# Patient Record
Sex: Male | Born: 1940 | Race: White | Hispanic: No | Smoking: Former smoker
Health system: Southern US, Community
[De-identification: ages and names within clinical notes are randomized; demographics above are authoritative.]

## PROBLEM LIST (undated history)

## (undated) DIAGNOSIS — I4891 Unspecified atrial fibrillation: Secondary | ICD-10-CM

## (undated) DIAGNOSIS — D509 Iron deficiency anemia, unspecified: Secondary | ICD-10-CM

## (undated) DIAGNOSIS — H269 Unspecified cataract: Secondary | ICD-10-CM

## (undated) DIAGNOSIS — I10 Essential (primary) hypertension: Secondary | ICD-10-CM

## (undated) DIAGNOSIS — I509 Heart failure, unspecified: Secondary | ICD-10-CM

## (undated) HISTORY — PX: APPENDECTOMY: SHX54

## (undated) HISTORY — DX: Iron deficiency anemia, unspecified: D50.9

## (undated) HISTORY — PX: HERNIA REPAIR: SHX51

## (undated) HISTORY — DX: Unspecified cataract: H26.9

## (undated) HISTORY — DX: Essential (primary) hypertension: I10

## (undated) HISTORY — DX: Heart failure, unspecified: I50.9

---

## 2015-04-14 DIAGNOSIS — Z23 Encounter for immunization: Secondary | ICD-10-CM | POA: Diagnosis not present

## 2015-04-14 DIAGNOSIS — I509 Heart failure, unspecified: Secondary | ICD-10-CM | POA: Diagnosis not present

## 2015-04-14 DIAGNOSIS — Z79899 Other long term (current) drug therapy: Secondary | ICD-10-CM | POA: Diagnosis not present

## 2015-04-14 DIAGNOSIS — I10 Essential (primary) hypertension: Secondary | ICD-10-CM | POA: Diagnosis not present

## 2015-04-14 DIAGNOSIS — Z6838 Body mass index (BMI) 38.0-38.9, adult: Secondary | ICD-10-CM | POA: Diagnosis not present

## 2015-04-14 DIAGNOSIS — Z1389 Encounter for screening for other disorder: Secondary | ICD-10-CM | POA: Diagnosis not present

## 2015-04-14 DIAGNOSIS — E669 Obesity, unspecified: Secondary | ICD-10-CM | POA: Diagnosis not present

## 2015-04-14 DIAGNOSIS — I4891 Unspecified atrial fibrillation: Secondary | ICD-10-CM | POA: Diagnosis not present

## 2015-04-15 DIAGNOSIS — I482 Chronic atrial fibrillation, unspecified: Secondary | ICD-10-CM | POA: Diagnosis present

## 2015-06-04 DIAGNOSIS — R609 Edema, unspecified: Secondary | ICD-10-CM | POA: Diagnosis not present

## 2015-06-04 DIAGNOSIS — I509 Heart failure, unspecified: Secondary | ICD-10-CM | POA: Diagnosis not present

## 2015-06-04 DIAGNOSIS — E669 Obesity, unspecified: Secondary | ICD-10-CM | POA: Diagnosis not present

## 2015-06-04 DIAGNOSIS — H269 Unspecified cataract: Secondary | ICD-10-CM | POA: Diagnosis not present

## 2015-06-04 DIAGNOSIS — I4891 Unspecified atrial fibrillation: Secondary | ICD-10-CM | POA: Diagnosis not present

## 2015-06-04 DIAGNOSIS — Z6838 Body mass index (BMI) 38.0-38.9, adult: Secondary | ICD-10-CM | POA: Diagnosis not present

## 2015-06-04 DIAGNOSIS — Z8673 Personal history of transient ischemic attack (TIA), and cerebral infarction without residual deficits: Secondary | ICD-10-CM | POA: Diagnosis not present

## 2015-06-04 DIAGNOSIS — I872 Venous insufficiency (chronic) (peripheral): Secondary | ICD-10-CM | POA: Diagnosis not present

## 2015-06-04 DIAGNOSIS — I110 Hypertensive heart disease with heart failure: Secondary | ICD-10-CM | POA: Diagnosis not present

## 2015-08-12 DIAGNOSIS — Z6837 Body mass index (BMI) 37.0-37.9, adult: Secondary | ICD-10-CM | POA: Diagnosis not present

## 2015-08-12 DIAGNOSIS — I509 Heart failure, unspecified: Secondary | ICD-10-CM | POA: Diagnosis not present

## 2015-08-12 DIAGNOSIS — Z79899 Other long term (current) drug therapy: Secondary | ICD-10-CM | POA: Diagnosis not present

## 2015-08-12 DIAGNOSIS — I10 Essential (primary) hypertension: Secondary | ICD-10-CM | POA: Diagnosis not present

## 2015-08-12 DIAGNOSIS — I4891 Unspecified atrial fibrillation: Secondary | ICD-10-CM | POA: Diagnosis not present

## 2015-12-31 DIAGNOSIS — Z23 Encounter for immunization: Secondary | ICD-10-CM | POA: Diagnosis not present

## 2015-12-31 DIAGNOSIS — I10 Essential (primary) hypertension: Secondary | ICD-10-CM | POA: Diagnosis not present

## 2015-12-31 DIAGNOSIS — Z1211 Encounter for screening for malignant neoplasm of colon: Secondary | ICD-10-CM | POA: Diagnosis not present

## 2015-12-31 DIAGNOSIS — Z6833 Body mass index (BMI) 33.0-33.9, adult: Secondary | ICD-10-CM | POA: Diagnosis not present

## 2015-12-31 DIAGNOSIS — I509 Heart failure, unspecified: Secondary | ICD-10-CM | POA: Diagnosis not present

## 2015-12-31 DIAGNOSIS — R634 Abnormal weight loss: Secondary | ICD-10-CM | POA: Diagnosis not present

## 2015-12-31 DIAGNOSIS — I4891 Unspecified atrial fibrillation: Secondary | ICD-10-CM | POA: Diagnosis not present

## 2015-12-31 DIAGNOSIS — Z125 Encounter for screening for malignant neoplasm of prostate: Secondary | ICD-10-CM | POA: Diagnosis not present

## 2016-05-04 DIAGNOSIS — Z1389 Encounter for screening for other disorder: Secondary | ICD-10-CM | POA: Diagnosis not present

## 2016-05-04 DIAGNOSIS — Z9181 History of falling: Secondary | ICD-10-CM | POA: Diagnosis not present

## 2016-05-04 DIAGNOSIS — I10 Essential (primary) hypertension: Secondary | ICD-10-CM | POA: Diagnosis not present

## 2016-05-04 DIAGNOSIS — I509 Heart failure, unspecified: Secondary | ICD-10-CM | POA: Diagnosis not present

## 2016-05-04 DIAGNOSIS — Z139 Encounter for screening, unspecified: Secondary | ICD-10-CM | POA: Diagnosis not present

## 2016-05-04 DIAGNOSIS — Z79899 Other long term (current) drug therapy: Secondary | ICD-10-CM | POA: Diagnosis not present

## 2016-05-04 DIAGNOSIS — Z23 Encounter for immunization: Secondary | ICD-10-CM | POA: Diagnosis not present

## 2016-05-04 DIAGNOSIS — Z6833 Body mass index (BMI) 33.0-33.9, adult: Secondary | ICD-10-CM | POA: Diagnosis not present

## 2016-05-04 DIAGNOSIS — I4891 Unspecified atrial fibrillation: Secondary | ICD-10-CM | POA: Diagnosis not present

## 2016-12-02 DIAGNOSIS — H2513 Age-related nuclear cataract, bilateral: Secondary | ICD-10-CM | POA: Diagnosis not present

## 2017-01-11 DIAGNOSIS — H269 Unspecified cataract: Secondary | ICD-10-CM | POA: Insufficient documentation

## 2017-01-11 DIAGNOSIS — H2513 Age-related nuclear cataract, bilateral: Secondary | ICD-10-CM | POA: Diagnosis not present

## 2017-03-03 DIAGNOSIS — Z23 Encounter for immunization: Secondary | ICD-10-CM | POA: Diagnosis not present

## 2017-03-03 DIAGNOSIS — Z79899 Other long term (current) drug therapy: Secondary | ICD-10-CM | POA: Diagnosis not present

## 2017-03-03 DIAGNOSIS — I4891 Unspecified atrial fibrillation: Secondary | ICD-10-CM | POA: Diagnosis not present

## 2017-03-03 DIAGNOSIS — J069 Acute upper respiratory infection, unspecified: Secondary | ICD-10-CM | POA: Diagnosis not present

## 2017-03-03 DIAGNOSIS — I10 Essential (primary) hypertension: Secondary | ICD-10-CM | POA: Diagnosis not present

## 2017-03-03 DIAGNOSIS — I509 Heart failure, unspecified: Secondary | ICD-10-CM | POA: Diagnosis not present

## 2017-09-01 DIAGNOSIS — H269 Unspecified cataract: Secondary | ICD-10-CM | POA: Diagnosis not present

## 2017-09-01 DIAGNOSIS — Z9181 History of falling: Secondary | ICD-10-CM | POA: Diagnosis not present

## 2017-09-01 DIAGNOSIS — I509 Heart failure, unspecified: Secondary | ICD-10-CM | POA: Diagnosis not present

## 2017-09-01 DIAGNOSIS — I10 Essential (primary) hypertension: Secondary | ICD-10-CM | POA: Diagnosis not present

## 2017-09-01 DIAGNOSIS — Z79899 Other long term (current) drug therapy: Secondary | ICD-10-CM | POA: Diagnosis not present

## 2017-09-01 DIAGNOSIS — Z1331 Encounter for screening for depression: Secondary | ICD-10-CM | POA: Diagnosis not present

## 2017-09-01 DIAGNOSIS — Z125 Encounter for screening for malignant neoplasm of prostate: Secondary | ICD-10-CM | POA: Diagnosis not present

## 2017-09-01 DIAGNOSIS — I4891 Unspecified atrial fibrillation: Secondary | ICD-10-CM | POA: Diagnosis not present

## 2017-12-16 DIAGNOSIS — Z139 Encounter for screening, unspecified: Secondary | ICD-10-CM | POA: Diagnosis not present

## 2017-12-16 DIAGNOSIS — Z0000 Encounter for general adult medical examination without abnormal findings: Secondary | ICD-10-CM | POA: Diagnosis not present

## 2017-12-16 DIAGNOSIS — Z136 Encounter for screening for cardiovascular disorders: Secondary | ICD-10-CM | POA: Diagnosis not present

## 2017-12-16 DIAGNOSIS — E785 Hyperlipidemia, unspecified: Secondary | ICD-10-CM | POA: Diagnosis not present

## 2017-12-16 DIAGNOSIS — E669 Obesity, unspecified: Secondary | ICD-10-CM | POA: Diagnosis not present

## 2017-12-16 DIAGNOSIS — Z1339 Encounter for screening examination for other mental health and behavioral disorders: Secondary | ICD-10-CM | POA: Diagnosis not present

## 2017-12-16 DIAGNOSIS — Z6831 Body mass index (BMI) 31.0-31.9, adult: Secondary | ICD-10-CM | POA: Diagnosis not present

## 2017-12-16 DIAGNOSIS — Z125 Encounter for screening for malignant neoplasm of prostate: Secondary | ICD-10-CM | POA: Diagnosis not present

## 2018-03-03 DIAGNOSIS — I4891 Unspecified atrial fibrillation: Secondary | ICD-10-CM | POA: Diagnosis not present

## 2018-03-03 DIAGNOSIS — I509 Heart failure, unspecified: Secondary | ICD-10-CM | POA: Diagnosis not present

## 2018-03-03 DIAGNOSIS — Z23 Encounter for immunization: Secondary | ICD-10-CM | POA: Diagnosis not present

## 2018-03-03 DIAGNOSIS — Z6830 Body mass index (BMI) 30.0-30.9, adult: Secondary | ICD-10-CM | POA: Diagnosis not present

## 2018-03-03 DIAGNOSIS — I10 Essential (primary) hypertension: Secondary | ICD-10-CM | POA: Diagnosis not present

## 2018-03-03 DIAGNOSIS — Z125 Encounter for screening for malignant neoplasm of prostate: Secondary | ICD-10-CM | POA: Diagnosis not present

## 2018-03-03 DIAGNOSIS — R634 Abnormal weight loss: Secondary | ICD-10-CM | POA: Diagnosis not present

## 2018-03-03 DIAGNOSIS — H269 Unspecified cataract: Secondary | ICD-10-CM | POA: Diagnosis not present

## 2018-03-03 DIAGNOSIS — Z79899 Other long term (current) drug therapy: Secondary | ICD-10-CM | POA: Diagnosis not present

## 2018-04-03 DIAGNOSIS — D649 Anemia, unspecified: Secondary | ICD-10-CM | POA: Diagnosis not present

## 2018-04-03 DIAGNOSIS — N289 Disorder of kidney and ureter, unspecified: Secondary | ICD-10-CM | POA: Diagnosis not present

## 2018-05-30 DIAGNOSIS — D509 Iron deficiency anemia, unspecified: Secondary | ICD-10-CM | POA: Diagnosis not present

## 2018-09-01 DIAGNOSIS — I509 Heart failure, unspecified: Secondary | ICD-10-CM | POA: Diagnosis not present

## 2018-09-01 DIAGNOSIS — Z6831 Body mass index (BMI) 31.0-31.9, adult: Secondary | ICD-10-CM | POA: Diagnosis not present

## 2018-09-01 DIAGNOSIS — H269 Unspecified cataract: Secondary | ICD-10-CM | POA: Diagnosis not present

## 2018-09-01 DIAGNOSIS — D509 Iron deficiency anemia, unspecified: Secondary | ICD-10-CM | POA: Diagnosis not present

## 2018-09-01 DIAGNOSIS — Z6830 Body mass index (BMI) 30.0-30.9, adult: Secondary | ICD-10-CM | POA: Diagnosis not present

## 2018-09-01 DIAGNOSIS — Z79899 Other long term (current) drug therapy: Secondary | ICD-10-CM | POA: Diagnosis not present

## 2018-09-01 DIAGNOSIS — I4891 Unspecified atrial fibrillation: Secondary | ICD-10-CM | POA: Diagnosis not present

## 2018-09-01 DIAGNOSIS — I10 Essential (primary) hypertension: Secondary | ICD-10-CM | POA: Diagnosis not present

## 2018-12-25 DIAGNOSIS — Z23 Encounter for immunization: Secondary | ICD-10-CM | POA: Diagnosis not present

## 2018-12-25 DIAGNOSIS — Z1211 Encounter for screening for malignant neoplasm of colon: Secondary | ICD-10-CM | POA: Diagnosis not present

## 2018-12-25 DIAGNOSIS — Z139 Encounter for screening, unspecified: Secondary | ICD-10-CM | POA: Diagnosis not present

## 2018-12-25 DIAGNOSIS — Z9181 History of falling: Secondary | ICD-10-CM | POA: Diagnosis not present

## 2018-12-25 DIAGNOSIS — Z1331 Encounter for screening for depression: Secondary | ICD-10-CM | POA: Diagnosis not present

## 2018-12-25 DIAGNOSIS — E785 Hyperlipidemia, unspecified: Secondary | ICD-10-CM | POA: Diagnosis not present

## 2018-12-25 DIAGNOSIS — Z0000 Encounter for general adult medical examination without abnormal findings: Secondary | ICD-10-CM | POA: Diagnosis not present

## 2018-12-25 DIAGNOSIS — Z6830 Body mass index (BMI) 30.0-30.9, adult: Secondary | ICD-10-CM | POA: Diagnosis not present

## 2018-12-25 DIAGNOSIS — Z125 Encounter for screening for malignant neoplasm of prostate: Secondary | ICD-10-CM | POA: Diagnosis not present

## 2019-02-27 DIAGNOSIS — Z20828 Contact with and (suspected) exposure to other viral communicable diseases: Secondary | ICD-10-CM | POA: Diagnosis not present

## 2019-02-27 DIAGNOSIS — J029 Acute pharyngitis, unspecified: Secondary | ICD-10-CM | POA: Diagnosis not present

## 2019-03-08 DIAGNOSIS — Z1211 Encounter for screening for malignant neoplasm of colon: Secondary | ICD-10-CM | POA: Diagnosis not present

## 2019-03-08 DIAGNOSIS — I509 Heart failure, unspecified: Secondary | ICD-10-CM | POA: Diagnosis not present

## 2019-03-20 DIAGNOSIS — Z79899 Other long term (current) drug therapy: Secondary | ICD-10-CM | POA: Diagnosis not present

## 2019-03-20 DIAGNOSIS — I10 Essential (primary) hypertension: Secondary | ICD-10-CM | POA: Diagnosis not present

## 2019-03-20 DIAGNOSIS — Z6832 Body mass index (BMI) 32.0-32.9, adult: Secondary | ICD-10-CM | POA: Diagnosis not present

## 2019-03-20 DIAGNOSIS — H269 Unspecified cataract: Secondary | ICD-10-CM | POA: Diagnosis not present

## 2019-03-20 DIAGNOSIS — D509 Iron deficiency anemia, unspecified: Secondary | ICD-10-CM | POA: Diagnosis not present

## 2019-03-20 DIAGNOSIS — I509 Heart failure, unspecified: Secondary | ICD-10-CM | POA: Diagnosis not present

## 2019-03-20 DIAGNOSIS — I4891 Unspecified atrial fibrillation: Secondary | ICD-10-CM | POA: Diagnosis not present

## 2019-04-08 ENCOUNTER — Emergency Department (HOSPITAL_BASED_OUTPATIENT_CLINIC_OR_DEPARTMENT_OTHER): Payer: Medicare Other

## 2019-04-08 ENCOUNTER — Other Ambulatory Visit: Payer: Self-pay

## 2019-04-08 ENCOUNTER — Encounter (HOSPITAL_COMMUNITY): Payer: Self-pay | Admitting: Emergency Medicine

## 2019-04-08 ENCOUNTER — Emergency Department (HOSPITAL_COMMUNITY): Payer: Medicare Other

## 2019-04-08 ENCOUNTER — Inpatient Hospital Stay (HOSPITAL_COMMUNITY)
Admission: EM | Admit: 2019-04-08 | Discharge: 2019-04-10 | DRG: 101 | Disposition: A | Discharge status: Home / Self Care | Payer: Medicare Other | Attending: Internal Medicine | Admitting: Internal Medicine

## 2019-04-08 DIAGNOSIS — W1830XA Fall on same level, unspecified, initial encounter: Secondary | ICD-10-CM | POA: Diagnosis present

## 2019-04-08 DIAGNOSIS — F0390 Unspecified dementia without behavioral disturbance: Secondary | ICD-10-CM | POA: Diagnosis present

## 2019-04-08 DIAGNOSIS — G9389 Other specified disorders of brain: Secondary | ICD-10-CM | POA: Diagnosis present

## 2019-04-08 DIAGNOSIS — R52 Pain, unspecified: Secondary | ICD-10-CM

## 2019-04-08 DIAGNOSIS — Z7901 Long term (current) use of anticoagulants: Secondary | ICD-10-CM | POA: Diagnosis not present

## 2019-04-08 DIAGNOSIS — R55 Syncope and collapse: Secondary | ICD-10-CM

## 2019-04-08 DIAGNOSIS — S0003XA Contusion of scalp, initial encounter: Secondary | ICD-10-CM | POA: Diagnosis not present

## 2019-04-08 DIAGNOSIS — I4891 Unspecified atrial fibrillation: Secondary | ICD-10-CM | POA: Diagnosis not present

## 2019-04-08 DIAGNOSIS — Y92009 Unspecified place in unspecified non-institutional (private) residence as the place of occurrence of the external cause: Secondary | ICD-10-CM | POA: Diagnosis not present

## 2019-04-08 DIAGNOSIS — Z8673 Personal history of transient ischemic attack (TIA), and cerebral infarction without residual deficits: Secondary | ICD-10-CM

## 2019-04-08 DIAGNOSIS — R32 Unspecified urinary incontinence: Secondary | ICD-10-CM | POA: Diagnosis not present

## 2019-04-08 DIAGNOSIS — I4510 Unspecified right bundle-branch block: Secondary | ICD-10-CM | POA: Diagnosis not present

## 2019-04-08 DIAGNOSIS — Z20822 Contact with and (suspected) exposure to covid-19: Secondary | ICD-10-CM | POA: Diagnosis not present

## 2019-04-08 DIAGNOSIS — R569 Unspecified convulsions: Secondary | ICD-10-CM | POA: Diagnosis not present

## 2019-04-08 DIAGNOSIS — I482 Chronic atrial fibrillation, unspecified: Secondary | ICD-10-CM | POA: Diagnosis present

## 2019-04-08 DIAGNOSIS — R4189 Other symptoms and signs involving cognitive functions and awareness: Secondary | ICD-10-CM

## 2019-04-08 DIAGNOSIS — Z79899 Other long term (current) drug therapy: Secondary | ICD-10-CM | POA: Diagnosis not present

## 2019-04-08 DIAGNOSIS — M6281 Muscle weakness (generalized): Secondary | ICD-10-CM | POA: Diagnosis not present

## 2019-04-08 HISTORY — DX: Unspecified atrial fibrillation: I48.91

## 2019-04-08 LAB — CBC WITH DIFFERENTIAL/PLATELET
Abs Immature Granulocytes: 0.13 10*3/uL — ABNORMAL HIGH (ref 0.00–0.07)
Basophils Absolute: 0.1 10*3/uL (ref 0.0–0.1)
Basophils Relative: 1 %
Eosinophils Absolute: 0.1 10*3/uL (ref 0.0–0.5)
Eosinophils Relative: 1 %
HCT: 37.1 % — ABNORMAL LOW (ref 39.0–52.0)
Hemoglobin: 12.2 g/dL — ABNORMAL LOW (ref 13.0–17.0)
Immature Granulocytes: 1 %
Lymphocytes Relative: 5 %
Lymphs Abs: 0.6 10*3/uL — ABNORMAL LOW (ref 0.7–4.0)
MCH: 28.9 pg (ref 26.0–34.0)
MCHC: 32.9 g/dL (ref 30.0–36.0)
MCV: 87.9 fL (ref 80.0–100.0)
Monocytes Absolute: 0.6 10*3/uL (ref 0.1–1.0)
Monocytes Relative: 6 %
Neutro Abs: 9.4 10*3/uL — ABNORMAL HIGH (ref 1.7–7.7)
Neutrophils Relative %: 86 %
Platelets: 233 10*3/uL (ref 150–400)
RBC: 4.22 MIL/uL (ref 4.22–5.81)
RDW: 16 % — ABNORMAL HIGH (ref 11.5–15.5)
WBC: 10.8 10*3/uL — ABNORMAL HIGH (ref 4.0–10.5)
nRBC: 0 % (ref 0.0–0.2)

## 2019-04-08 LAB — COMPREHENSIVE METABOLIC PANEL
ALT: 12 U/L (ref 0–44)
AST: 22 U/L (ref 15–41)
Albumin: 3.6 g/dL (ref 3.5–5.0)
Alkaline Phosphatase: 51 U/L (ref 38–126)
Anion gap: 8 (ref 5–15)
BUN: 15 mg/dL (ref 8–23)
CO2: 27 mmol/L (ref 22–32)
Calcium: 8.5 mg/dL — ABNORMAL LOW (ref 8.9–10.3)
Chloride: 105 mmol/L (ref 98–111)
Creatinine, Ser: 1.1 mg/dL (ref 0.61–1.24)
GFR calc Af Amer: >60 mL/min (ref >60)
GFR calc non Af Amer: >60 mL/min (ref >60)
Glucose, Bld: 137 mg/dL — ABNORMAL HIGH (ref 70–99)
Potassium: 3.7 mmol/L (ref 3.5–5.1)
Sodium: 140 mmol/L (ref 135–145)
Total Bilirubin: 0.6 mg/dL (ref 0.3–1.2)
Total Protein: 6.5 g/dL (ref 6.5–8.1)

## 2019-04-08 LAB — LIPID PANEL
Cholesterol: 193 mg/dL (ref 0–200)
HDL: 52 mg/dL (ref >40)
LDL Cholesterol: 130 mg/dL — ABNORMAL HIGH (ref 0–99)
Total CHOL/HDL Ratio: 3.7 RATIO
Triglycerides: 53 mg/dL (ref <150)
VLDL: 11 mg/dL (ref 0–40)

## 2019-04-08 LAB — URINALYSIS, ROUTINE W REFLEX MICROSCOPIC
Bilirubin Urine: NEGATIVE
Glucose, UA: NEGATIVE mg/dL
Ketones, ur: NEGATIVE mg/dL
Nitrite: NEGATIVE
Protein, ur: 30 mg/dL — AB
Specific Gravity, Urine: 1.019 (ref 1.005–1.030)
pH: 5 (ref 5.0–8.0)

## 2019-04-08 LAB — PHOSPHORUS: Phosphorus: 3.2 mg/dL (ref 2.5–4.6)

## 2019-04-08 LAB — CBC
HCT: 35.6 % — ABNORMAL LOW (ref 39.0–52.0)
Hemoglobin: 11.4 g/dL — ABNORMAL LOW (ref 13.0–17.0)
MCH: 28.2 pg (ref 26.0–34.0)
MCHC: 32 g/dL (ref 30.0–36.0)
MCV: 88.1 fL (ref 80.0–100.0)
Platelets: 222 10*3/uL (ref 150–400)
RBC: 4.04 MIL/uL — ABNORMAL LOW (ref 4.22–5.81)
RDW: 16.4 % — ABNORMAL HIGH (ref 11.5–15.5)
WBC: 7.4 10*3/uL (ref 4.0–10.5)
nRBC: 0 % (ref 0.0–0.2)

## 2019-04-08 LAB — CREATININE, SERUM
Creatinine, Ser: 0.99 mg/dL (ref 0.61–1.24)
GFR calc Af Amer: >60 mL/min (ref >60)
GFR calc non Af Amer: >60 mL/min (ref >60)

## 2019-04-08 LAB — ECHOCARDIOGRAM COMPLETE: Weight: 3168 oz

## 2019-04-08 LAB — SARS CORONAVIRUS 2 (TAT 6-24 HRS): SARS Coronavirus 2: NEGATIVE

## 2019-04-08 LAB — HEMOGLOBIN A1C
Hgb A1c MFr Bld: 5.7 % — ABNORMAL HIGH (ref 4.8–5.6)
Mean Plasma Glucose: 116.89 mg/dL

## 2019-04-08 LAB — TROPONIN I (HIGH SENSITIVITY)
Troponin I (High Sensitivity): 86 ng/L — ABNORMAL HIGH (ref <18)
Troponin I (High Sensitivity): 207 ng/L (ref <18)

## 2019-04-08 LAB — MAGNESIUM: Magnesium: 2.2 mg/dL (ref 1.7–2.4)

## 2019-04-08 MED ORDER — HEPARIN SODIUM (PORCINE) 5000 UNIT/ML IJ SOLN
5000.0000 [IU] | Freq: Three times a day (TID) | INTRAMUSCULAR | Status: DC
  Administered 2019-04-08 (×2): 5000 [IU] via SUBCUTANEOUS
  Filled 2019-04-08 (×2): qty 1

## 2019-04-08 MED ORDER — ATORVASTATIN CALCIUM 80 MG PO TABS
80.0000 mg | ORAL_TABLET | Freq: Every day | ORAL | Status: DC
  Administered 2019-04-09: 17:00:00 80 mg via ORAL
  Filled 2019-04-08: qty 1

## 2019-04-08 MED ORDER — FUROSEMIDE 40 MG PO TABS
40.0000 mg | ORAL_TABLET | Freq: Every day | ORAL | Status: DC
  Administered 2019-04-09 – 2019-04-10 (×2): 40 mg via ORAL
  Filled 2019-04-08 (×2): qty 1

## 2019-04-08 MED ORDER — METOPROLOL SUCCINATE ER 25 MG PO TB24
25.0000 mg | ORAL_TABLET | Freq: Every day | ORAL | Status: DC
  Administered 2019-04-09 – 2019-04-10 (×2): 25 mg via ORAL
  Filled 2019-04-08 (×2): qty 1

## 2019-04-08 MED ORDER — LORAZEPAM 2 MG/ML IJ SOLN: 1.0000 mg | INTRAMUSCULAR | Status: DC | PRN

## 2019-04-08 MED ORDER — HEPARIN (PORCINE) 25000 UT/250ML-% IV SOLN
1000.0000 [IU]/h | INTRAVENOUS | Status: DC
  Administered 2019-04-08: 1150 [IU]/h via INTRAVENOUS
  Administered 2019-04-09: 21:00:00 1000 [IU]/h via INTRAVENOUS
  Filled 2019-04-08 (×2): qty 250

## 2019-04-08 MED ORDER — FERROUS SULFATE 325 (65 FE) MG PO TABS
325.0000 mg | ORAL_TABLET | Freq: Every day | ORAL | Status: DC
  Administered 2019-04-09 – 2019-04-10 (×2): 325 mg via ORAL
  Filled 2019-04-08 (×2): qty 1

## 2019-04-08 NOTE — Consult Note (Addendum)
Neurology Consultation Reason for Consult: Syncope Referring Physician: Angelia Mould( attending)  CC: Syncope  History is obtained from: Patient, daughter  HPI: Brandon Mata is a 79 y.o. male with a history of A. Fib who presents with episode of loss of consciousness that occurred earlier tonight.  Per his daughter, she heard a thump.  She thinks he had gotten up to go to the bathroom given that was before he normally wakes up, and she found him unresponsive on the floor.  It took her approximately 5 minutes between hearing the thump and finding him unresponsive.  Her uncle checked and did not see that he was breathing and therefore she started CPR.  Few minutes then, he began jerking with his right arm and bilateral legs.  She states that they were synchronous movements and lasted for approximately 10 minutes.  She is not certain how long she did CPR for, but was more than 5 minutes.  He does not seem to have a clear memory of the events, but states that he does remember his daughter performing CPR on him.  He also states that he thinks it happened when they came home and was in the driveway.  He states his last thing he remembers and thinks that the event occurred at that time, though it did not occur until the next morning.  He states that he remembers having a severe chest pain prior to onset, but again reliability is unclear.  He is complaining of right shoulder and chest pain.  Also of note, she states that sometimes he does not respond to her when she asked him something.  She repeatedly say " daddy..." But he will not answer her for a few minutes.  She then yells at him and he acts mad that he is being yelled at.  She has no concerns about short-term memory, though she does note that he repeats himself telling the same thing day after day.  LKW: 1/9 prior to bed tpa given?: no, outside window    ROS: A 14 point ROS was performed and is negative except as noted in the HPI.   Past  Medical History:  Diagnosis Date  . A-fib [facility])      Daughter-seizures (his wife, daughter's mother has a family history of epilepsy and it was presumed came from that side)   Social History:  has no history on file for tobacco, alcohol, and drug.   Exam: Current vital signs: BP 122/73 (BP Location: Right Arm)   Pulse 80   Temp 99.6 F (37.6 C) (Oral)   Resp 20   Ht 5\' 5"  (1.651 m)   Wt 88.2 kg   SpO2 98%   BMI 32.36 kg/m  Vital signs in last 24 hours: Temp:  [97.9 F (36.6 C)-99.6 F (37.6 C)] 99.6 F (37.6 C) (01/10 2029) Pulse Rate:  [76-115] 80 (01/10 2029) Resp:  [8-29] 20 (01/10 2029) BP: (105-139)/(54-115) 122/73 (01/10 2029) SpO2:  [92 %-99 %] 98 % (01/10 2029) Weight:  [88.2 kg-89.8 kg] 88.2 kg (01/10 2029)   Physical Exam  Constitutional: Appears well-developed and well-nourished.  Psych: Affect appropriate to situation Eyes: No scleral injection HENT: No OP obstrucion MSK: no joint deformities.  Cardiovascular: Normal rate and regular rhythm.  Respiratory: Effort normal, non-labored breathing GI: Soft.  No distension. There is no tenderness.  Skin: WDI  Neuro: Mental Status: Patient is awake, alert, oriented to person, place, month, year, and situation. Patient is able to give a clear and coherent  history. No signs of aphasia or neglect Cranial Nerves: II: Vision is intact to hand waving only. pupils are equal, round, and reactive to light.  He has cataracts bilaterally. III,IV, VI: EOMI without ptosis or diploplia.  V: Facial sensation is symmetric to temperature VII: Though I question slight facial asymmetry with weakness on the right, I think that this is likely due to his edentulous state. VIII: hearing is intact to voice X: Uvula elevates symmetrically XI: Shoulder shrug is symmetric. XII: tongue is midline without atrophy or fasciculations.  Motor: Tone is normal. Bulk is normal. 5/5 strength was present in all four extremities, with the  exception of holding his right arm up which is limited due to pain. Sensory: Sensation is symmetric to light touch and temperature in the arms and legs. Cerebellar: FNF  intact bilaterally  I have reviewed labs in epic and the results pertinent to this consultation are: CMP-unremarkable LDL-130  I have reviewed the images obtained: CT head- Negative  Impression: 79 year old male with history of stroke who presents with loss of consciousness followed by seizure.  Given the 5 minutes between the daughter hearing of the thumb and her arriving, it is possible that he had already had a seizure but stopped moving at the time of her arrival.  It is also possible that he had some other event such as a cardiac arrest causing a provoked seizure.  The staring spells that the daughter describes are concerning, but not definite for seizure activity.  I do think that if the MRI reveals a clear cortical insult or the EEG reveals evidence of seizure predisposition, but I would strongly consider antiepileptic therapy.  If this is all negative, then I think it is less clear and frank discussion of starting medication versus not starting for a single seizure would need to be had with the patient and likely daughter.  Recommendations: 1) MRI brain 2) EEG 3) neurology will continue to follow   Ritta Slot, MD [facility] [phone]  If 7pm- 7am, please page neurology on call as listed in AMION.

## 2019-04-08 NOTE — Progress Notes (Signed)
ANTICOAGULATION CONSULT NOTE - Initial Consult  Pharmacy Consult for heparin Indication: atrial fibrillation   Patient Measurements: Weight: 198 lb (89.8 kg) Heparin Dosing Weight: 80 kg   Vital Signs: BP: 113/56 (01/10 1930) Pulse Rate: 82 (01/10 1930)  Labs: Recent Labs    04/08/19 0750 04/08/19 0937 04/08/19 1551  HGB 12.2*  --  11.4*  HCT 37.1*  --  35.6*  PLT 233  --  222  CREATININE 1.10  --  0.99  TROPONINIHS 86* 207*  --     Assessment: 79 yo male admitted due to syncopal event.  He is on Xarelto prior to admission for AFib. His last dose was yesterday morning. He did receive heparin SQ prophylaxis earlier today. Will transition patient to heparin. SCr 1.1, h/h plts wnl.  Goal of Therapy:  Heparin level 0.3-0.7 units/ml Monitor platelets by anticoagulation protocol: Yes   Plan:  -Heparin infusion at 1150 units/hr -Daily HL, CBC, aPTT    Baldemar Friday 04/08/2019,8:14 PM

## 2019-04-08 NOTE — ED Notes (Signed)
Attempted report to [facility].... Pt sitting on side of bed to eat dinner

## 2019-04-08 NOTE — Progress Notes (Signed)
Per patient RN was asked to called daughter and brother. Phone continued to ring with no voicemail.

## 2019-04-08 NOTE — ED Notes (Signed)
Patient transported to CT 

## 2019-04-08 NOTE — ED Triage Notes (Addendum)
Pt from home. Family states pt got up to use urinal and his body tensed up & pt fell (bruise on forehead) . Family told dispatch that the pt was breathing and dispatch told family to do compressions til EMS arrived. Whem ems arrived pt was scream and pushing family off of him. EMS states at first he was combative. PT has superfcial pain in center of chest. Hx. Of afib takes xarelto.   Pt has a deformity to left forearm...unsure if that happened this morning or not.

## 2019-04-08 NOTE — Consult Note (Signed)
Cardiology Consultation:   Patient ID: Brandon Mata MRN: [id number]; DOB: 08-12-1940  Admit date: 04/08/2019 Date of Consult: 04/08/2019  Primary Care Provider: Practice, [facility] Primary Cardiologist: Mata primary care provider on file. none Primary Electrophysiologist:  None    Patient Profile:   Brandon Mata is a 79 y.o. male with a hx of atrial fib and a remote stroke who is being seen today for the evaluation of syncope at the request of Dr. Mikey Bussing.  History of Present Illness:   Brandon Mata presents today after passing out. He is not sure he passed out but according to his daughter, they heard a loud thump upstairs and found the patient unresponsive, called 911 and started CPR. He awakened and was confused. He did not bite his tongue but noted chest pain. He lost control of his bladder and did not feel like he could feel that the episode was about to occur. Little warning. He did not bite his tongue. He denies chest pain or sob. He does have a h/o syncope.    Past Medical History:  Diagnosis Date  . A-fib (HCC)        Home Medications:  Prior to Admission medications   Medication Sig Start Date End Date Taking? Authorizing Provider  FEROSUL 325 (65 Fe) MG tablet Take 325 mg by mouth daily. 03/21/19  Yes [provider]  furosemide (LASIX) 40 MG tablet Take 40 mg by mouth daily.   Yes [provider]  metoprolol succinate (TOPROL-XL) 25 MG 24 hr tablet Take 25 mg by mouth daily. 04/05/19  Yes [provider]  XARELTO 20 MG TABS tablet Take 20 mg by mouth daily. 04/05/19  Yes [provider]    Inpatient Medications: Scheduled Meds: . [START ON 04/09/2019] atorvastatin  80 mg Oral q1800  . [START ON 04/09/2019] ferrous sulfate  325 mg Oral Daily  . [START ON 04/09/2019] furosemide  40 mg Oral Daily  . heparin  5,000 Units Subcutaneous Q8H  . [START ON 04/09/2019] metoprolol succinate  25 mg Oral Daily   Continuous  Infusions:  PRN Meds: LORazepam  Allergies:   Mata Known Allergies  Social History:   Social History   Socioeconomic History  . Marital status: Divorced    Spouse name: Not on file  . Number of children: Not on file  . Years of education: Not on file  . Highest education level: Not on file  Occupational History  . Not on file  Tobacco Use  . Smoking status: Not on file  Substance and Sexual Activity  . Alcohol use: Not on file  . Drug use: Not on file  . Sexual activity: Not on file  Other Topics Concern  . Not on file  Social History Narrative  . Not on file   Social Determinants of Health   Financial Resource Strain:   . Difficulty of Paying Living Expenses: Not on file  Food Insecurity:   . Worried About Programme researcher, broadcasting/film/video in the Last Year: Not on file  . Ran Out of Food in the Last Year: Not on file  Transportation Needs:   . Lack of Transportation (Medical): Not on file  . Lack of Transportation (Non-Medical): Not on file  Physical Activity:   . Days of Exercise per Week: Not on file  . Minutes of Exercise per Session: Not on file  Stress:   . Feeling of Stress : Not on file  Social Connections:   . Frequency of Communication  with Friends and Family: Not on file  . Frequency of Social Gatherings with Friends and Family: Not on file  . Attends Religious Services: Not on file  . Active Member of Clubs or Organizations: Not on file  . Attends Banker Meetings: Not on file  . Marital Status: Not on file  Intimate Partner Violence:   . Fear of Current or Ex-Partner: Not on file  . Emotionally Abused: Not on file  . Physically Abused: Not on file  . Sexually Abused: Not on file    Family History:   Mata family history on file.   ROS:  Please see the history of present illness.   All other ROS reviewed and negative.     Physical Exam/Data:   Vitals:   04/08/19 1600 04/08/19 1615 04/08/19 1630 04/08/19 1645  BP:  128/86 132/83 124/77   Pulse:   79 88  Resp: (!) 8 (!) 29 20 18   Temp:      TempSrc:      SpO2:   97% 97%  Weight:        Intake/Output Summary (Last 24 hours) at 04/08/2019 1703 Last data filed at 04/08/2019 1400 Gross per 24 hour  Intake --  Output 650 ml  Net -650 ml   Last 3 Weights 04/08/2019  Weight (lbs) 198 lb  Weight (kg) 89.812 kg     There is Mata height or weight on file to calculate BMI.  General:  Well nourished, well developed, in Mata acute distress HEENT: normal Lymph: Mata adenopathy Neck: 6 cm JVD Endocrine:  Mata thryomegaly Vascular: Mata carotid bruits; FA pulses 2+ bilaterally without bruits  Cardiac:  normal S1, S2; IRIRR; Mata murmur  Lungs:  clear to auscultation bilaterally, Mata wheezing, rhonchi or rales  Abd: soft, nontender, Mata hepatomegaly  Ext: Mata edema Musculoskeletal:  Mata deformities, BUE and BLE strength normal and equal Skin: warm and dry  Neuro:  CNs 2-12 intact, Mata focal abnormalities noted; speech a bit slurred, ?questionable at baseline Psych:  Normal affect   EKG:  The EKG was personally reviewed and demonstrates:  Atrial fib with RBBB Telemetry:  Telemetry was personally reviewed and demonstrates:  Atrial fib with a controlled VR  Relevant CV Studies: 2D echo is pending  Laboratory Data:  High Sensitivity Troponin:   Recent Labs  Lab 04/08/19 0750 04/08/19 0937  TROPONINIHS 86* 207*     Chemistry Recent Labs  Lab 04/08/19 0750 04/08/19 1551  NA 140  --   K 3.7  --   CL 105  --   CO2 27  --   GLUCOSE 137*  --   BUN 15  --   CREATININE 1.10 0.99  CALCIUM 8.5*  --   GFRNONAA >60 >60  GFRAA >60 >60  ANIONGAP 8  --     Recent Labs  Lab 04/08/19 0750  PROT 6.5  ALBUMIN 3.6  AST 22  ALT 12  ALKPHOS 51  BILITOT 0.6   Hematology Recent Labs  Lab 04/08/19 0750 04/08/19 1551  WBC 10.8* 7.4  RBC 4.22 4.04*  HGB 12.2* 11.4*  HCT 37.1* 35.6*  MCV 87.9 88.1  MCH 28.9 28.2  MCHC 32.9 32.0  RDW 16.0* 16.4*  PLT 233 222   BNPNo results  for input(s): BNP, PROBNP in the last 168 hours.  DDimer Mata results for input(s): DDIMER in the last 168 hours.   Radiology/Studies:  DG Ribs Bilateral W/Chest  Result Date: 04/08/2019 CLINICAL DATA:  Fall  from standing with rib pain EXAM: BILATERAL RIBS AND CHEST - 4+ VIEW COMPARISON:  None. FINDINGS: A chronic appearing fracture is seen involving the posterior right fifth rib and the lateral left seventh rib. Cortical irregularity of the lateral right fifth rib and posterolateral right sixth rib may represent age indeterminate nondisplaced fractures. The heart appears enlarged accounting for technique. Vascular calcifications are seen in the aortic arch. Mata focal consolidation, pleural effusion or pneumothorax is identified. IMPRESSION: 1. Cortical irregularity of the lateral right fifth rib and posterolateral right sixth rib may represent age indeterminate nondisplaced fractures. Correlation with point tenderness is recommended. Electronically Signed   By: Zerita Boers M.D.   On: 04/08/2019 09:12   DG Forearm Left  Result Date: 04/08/2019 CLINICAL DATA:  Arm deformity after a fall at home EXAM: LEFT FOREARM - 2 VIEW COMPARISON:  None. FINDINGS: There is Mata evidence of fracture or other focal bone lesions. There is a large hematoma in the forearm. IMPRESSION: Large hematoma in the forearm. Mata underlying fracture is identified. Mata gross soft tissue gas. Mata radiopaque foreign body. Electronically Signed   By: Zerita Boers M.D.   On: 04/08/2019 09:15   CT Head Wo Contrast  Result Date: 04/08/2019 CLINICAL DATA:  Pain following fall EXAM: CT HEAD WITHOUT CONTRAST CT CERVICAL SPINE WITHOUT CONTRAST TECHNIQUE: Multidetector CT imaging of the head and cervical spine was performed following the standard protocol without intravenous contrast. Multiplanar CT image reconstructions of the cervical spine were also generated. COMPARISON:  None. FINDINGS: CT HEAD FINDINGS Brain: There is age related volume loss.  Prominence of the cisterna magna is an apparent anatomic variant. There is Mata evident intracranial mass hemorrhage, extra-axial fluid collection, or midline shift. There is patchy small vessel disease in the centra semiovale bilaterally. Mata acute infarct evident. Vascular: Mata hyperdense vessel. Mata appreciable vascular calcification evident. Skull: The bony calvarium appears intact. There is a small right frontal scalp hematoma. Sinuses/Orbits: There is mild mucosal thickening in several ethmoid air cells. There is a retention cyst in the inferior right maxillary antrum. Other paranasal sinuses are clear. Orbits appear symmetric bilaterally. Other: Mastoid air cells are clear. There is debris in each external auditory canal. CT CERVICAL SPINE FINDINGS Alignment: There is Mata evident spondylolisthesis. Skull base and vertebrae: Skull base and craniocervical junction regions appear normal. Mata fracture evident. There is lucency in the posterior right first rib with cortical thinning in this area. Mata similar changes elsewhere. Soft tissues and spinal canal: Prevertebral soft tissues and predental space regions are normal. Mata cord or canal hematoma evident. Mata paraspinous lesions. Disc levels: There is slight disc space narrowing posteriorly C5-6. Other disc spaces appear unremarkable. There is central disc bulging at C2-3 which abuts but does not efface the cord. There is multilevel facet hypertrophy. There is Mata frank disc extrusion or stenosis. Note that there is exit foraminal narrowing due to bony hypertrophy at C5-6 bilaterally due to bony hypertrophy. Upper chest: Visualized upper lung regions are clear. Other: There is slight calcification in the right carotid artery. IMPRESSION: CT head: 1. Age related volume loss with patchy periventricular small vessel disease. Mata acute infarct. Mata mass or hemorrhage. 2.  Small right frontal scalp hematoma.  Mata evident fracture. 3.  Foci of paranasal sinus disease. 4.  Probable  cerumen in each external auditory canal. CT cervical spine: 1.  Mata fracture or spondylolisthesis. 2. 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 Mata frank disc extrusion or stenosis. 3. Lucency involving a portion  of the posterior right first rib with cortical thinning. This appearance potentially may be indicative lytic phase of Paget's disease. Neoplastic change in this bone is a differential consideration. Given this finding, it may be prudent to correlate nonemergently with nuclear medicine bone scan to assess for potential other areas of similar abnormality. Electronically Signed   By: Bretta Bang III M.D.   On: 04/08/2019 08:34   CT Cervical Spine Wo Contrast  Result Date: 04/08/2019 CLINICAL DATA:  Pain following fall EXAM: CT HEAD WITHOUT CONTRAST CT CERVICAL SPINE WITHOUT CONTRAST TECHNIQUE: Multidetector CT imaging of the head and cervical spine was performed following the standard protocol without intravenous contrast. Multiplanar CT image reconstructions of the cervical spine were also generated. COMPARISON:  None. FINDINGS: CT HEAD FINDINGS Brain: There is age related volume loss. Prominence of the cisterna magna is an apparent anatomic variant. There is Mata evident intracranial mass hemorrhage, extra-axial fluid collection, or midline shift. There is patchy small vessel disease in the centra semiovale bilaterally. Mata acute infarct evident. Vascular: Mata hyperdense vessel. Mata appreciable vascular calcification evident. Skull: The bony calvarium appears intact. There is a small right frontal scalp hematoma. Sinuses/Orbits: There is mild mucosal thickening in several ethmoid air cells. There is a retention cyst in the inferior right maxillary antrum. Other paranasal sinuses are clear. Orbits appear symmetric bilaterally. Other: Mastoid air cells are clear. There is debris in each external auditory canal. CT CERVICAL SPINE FINDINGS Alignment: There is Mata evident spondylolisthesis. Skull base and  vertebrae: Skull base and craniocervical junction regions appear normal. Mata fracture evident. There is lucency in the posterior right first rib with cortical thinning in this area. Mata similar changes elsewhere. Soft tissues and spinal canal: Prevertebral soft tissues and predental space regions are normal. Mata cord or canal hematoma evident. Mata paraspinous lesions. Disc levels: There is slight disc space narrowing posteriorly C5-6. Other disc spaces appear unremarkable. There is central disc bulging at C2-3 which abuts but does not efface the cord. There is multilevel facet hypertrophy. There is Mata frank disc extrusion or stenosis. Note that there is exit foraminal narrowing due to bony hypertrophy at C5-6 bilaterally due to bony hypertrophy. Upper chest: Visualized upper lung regions are clear. Other: There is slight calcification in the right carotid artery. IMPRESSION: CT head: 1. Age related volume loss with patchy periventricular small vessel disease. Mata acute infarct. Mata mass or hemorrhage. 2.  Small right frontal scalp hematoma.  Mata evident fracture. 3.  Foci of paranasal sinus disease. 4.  Probable cerumen in each external auditory canal. CT cervical spine: 1.  Mata fracture or spondylolisthesis. 2. 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 Mata frank disc extrusion or stenosis. 3. Lucency involving a portion of the posterior right first rib with cortical thinning. This appearance potentially may be indicative lytic phase of Paget's disease. Neoplastic change in this bone is a differential consideration. Given this finding, it may be prudent to correlate nonemergently with nuclear medicine bone scan to assess for potential other areas of similar abnormality. Electronically Signed   By: Bretta Bang III M.D.   On: 04/08/2019 08:34   Assessment and Plan:   1. Syncope - the etiology is unclear but atrial fib and RBBB/LAFB makes the possibility of a long pause or VT possible. He will undergo a 2D echo.  Other testing pending results of the echo.  2. Atrial fib- he has been on systemic anti-coagulation and his rate appears to be controlled on low dose beta blockers.  I would suggest tele to follow HR.      For questions or updates, please contact [facility] Please consult [email or web address] for contact info under     Signed, Lewayne BuntingGregg Orla Estrin, MD  04/08/2019 5:03 PM

## 2019-04-08 NOTE — Progress Notes (Signed)
  Echocardiogram 2D Echocardiogram has been performed.  Ryler Laskowski G Samyuktha Brau 04/08/2019, 4:08 PM

## 2019-04-08 NOTE — ED Notes (Signed)
Report given to 5C RN. All questions answered 

## 2019-04-08 NOTE — ED Notes (Signed)
Date and time results received: 04/08/19 1040 (use smartphrase ".now" to insert current time)  Test: trop Critical Value: 207  Name of Provider Notified: dykstra  Orders Received? Or Actions Taken?: .
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
## 2019-04-08 NOTE — ED Provider Notes (Addendum)
[facility] EMERGENCY DEPARTMENT Provider Note   CSN: [id number] Arrival date & time: 04/08/19  0710     History Chief Complaint  Patient presents with  . Altered Mental Status    Brandon Mata is a 79 y.o. male.  Presents to the emergency department with concern for altered mental status, episode of syncope versus seizure.  Family reports patient got up to use the bathroom when he suddenly passed out, tensed up and went unresponsive.  Family called 911 and started CPR.  States was confused and became somewhat agitated.  EMS states patient was awake.  Family denies any preceding falls, but fell during this episode and hit his forehead.  Patient does have history of A. fib, is on anticoagulation, Xarelto.  No prior history of seizure.  Yesterday was acting appropriate.  Patient currently having some chest discomfort that he has noticed since the chest compressions were performed.  States he was not having any pain previously.  Denies any other acute complaints.  HPI     Past Medical History:  Diagnosis Date  . A-fib [facility])     Patient Active Problem List   Diagnosis Date Noted  . Syncope 04/08/2019    No family history on file.  Social History   Tobacco Use  . Smoking status: Not on file  Substance Use Topics  . Alcohol use: Not on file  . Drug use: Not on file    Home Medications Prior to Admission medications   Medication Sig Start Date End Date Taking? Authorizing Provider  FEROSUL 325 (65 Fe) MG tablet Take 325 mg by mouth daily. 03/21/19  Yes [provider]  furosemide (LASIX) 40 MG tablet Take 40 mg by mouth daily.   Yes [provider]  metoprolol succinate (TOPROL-XL) 25 MG 24 hr tablet Take 25 mg by mouth daily. 04/05/19  Yes [provider]  XARELTO 20 MG TABS tablet Take 20 mg by mouth daily. 04/05/19  Yes [provider]    Allergies    Patient has no known allergies.  Review of Systems   Review of  Systems  Unable to perform ROS: Mental status change    Physical Exam Updated Vital Signs BP (!) 129/115   Pulse 77   Temp 97.9 F (36.6 C) (Oral)   Resp (!) 24   Wt 89.8 kg   SpO2 98%   Physical Exam Vitals and nursing note reviewed.  Constitutional:      Appearance: He is well-developed.  HENT:     Head: Normocephalic and atraumatic.  Eyes:     Conjunctiva/sclera: Conjunctivae normal.  Cardiovascular:     Rate and Rhythm: Normal rate and regular rhythm.     Heart sounds: No murmur.  Pulmonary:     Effort: Pulmonary effort is normal. No respiratory distress.     Breath sounds: Normal breath sounds.  Abdominal:     Palpations: Abdomen is soft.     Tenderness: There is no abdominal tenderness.  Musculoskeletal:     Cervical back: Neck supple.  Skin:    General: Skin is warm and dry.  Neurological:     General: No focal deficit present.     Mental Status: He is alert.     Comments: Alert, oriented, but mildly confused, moves all four extremities evenly     ED Results / Procedures / Treatments   Labs (all labs ordered are listed, but only abnormal results are displayed) Labs Reviewed  CBC WITH DIFFERENTIAL/PLATELET -  Abnormal; Notable for the following components:      Result Value   WBC 10.8 (*)    Hemoglobin 12.2 (*)    HCT 37.1 (*)    RDW 16.0 (*)    Neutro Abs 9.4 (*)    Lymphs Abs 0.6 (*)    Abs Immature Granulocytes 0.13 (*)    All other components within normal limits  COMPREHENSIVE METABOLIC PANEL - Abnormal; Notable for the following components:   Glucose, Bld 137 (*)    Calcium 8.5 (*)    All other components within normal limits  URINALYSIS, ROUTINE W REFLEX MICROSCOPIC - Abnormal; Notable for the following components:   APPearance TURBID (*)    Hgb urine dipstick MODERATE (*)    Protein, ur 30 (*)    Leukocytes,Ua TRACE (*)    Bacteria, UA RARE (*)    All other components within normal limits  LIPID PANEL - Abnormal; Notable for the  following components:   LDL Cholesterol 130 (*)    All other components within normal limits  HEMOGLOBIN A1C - Abnormal; Notable for the following components:   Hgb A1c MFr Bld 5.7 (*)    All other components within normal limits  CBC - Abnormal; Notable for the following components:   RBC 4.04 (*)    Hemoglobin 11.4 (*)    HCT 35.6 (*)    RDW 16.4 (*)    All other components within normal limits  TROPONIN I (HIGH SENSITIVITY) - Abnormal; Notable for the following components:   Troponin I (High Sensitivity) 86 (*)    All other components within normal limits  TROPONIN I (HIGH SENSITIVITY) - Abnormal; Notable for the following components:   Troponin I (High Sensitivity) 207 (*)    All other components within normal limits  SARS CORONAVIRUS 2 (TAT 6-24 HRS)  CREATININE, SERUM    EKG EKG Interpretation  Date/Time:  Sunday April 08 2019 07:12:33 EST Ventricular Rate:  95 PR Interval:    QRS Duration: 155 QT Interval:  406 QTC Calculation: 508 R Axis:   -81 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 FINDINGS: A chronic appearing fracture is seen involving the posterior right fifth rib and the lateral left seventh rib. Cortical irregularity of the lateral right fifth rib and posterolateral right sixth rib may represent age indeterminate nondisplaced fractures. The heart appears enlarged accounting for technique. Vascular calcifications are seen in the aortic arch. No focal consolidation, pleural effusion or pneumothorax is identified. IMPRESSION: 1. Cortical irregularity of the lateral right fifth rib and posterolateral right sixth rib may represent age indeterminate nondisplaced fractures. Correlation with point tenderness is recommended.  Electronically Signed   By: Zerita Boers M.D.   On: 04/08/2019 09:12   DG Forearm Left  Result Date: 04/08/2019 CLINICAL DATA:  Arm deformity after a fall at home EXAM: LEFT FOREARM - 2 VIEW COMPARISON:  None. FINDINGS: There is no evidence of fracture or other focal bone lesions. There is a large hematoma in the forearm. IMPRESSION: Large hematoma in the forearm. No underlying fracture is identified. No gross soft tissue gas. No radiopaque foreign body. Electronically Signed   By: Zerita Boers M.D.   On: 04/08/2019 09:15   CT Head Wo Contrast  Result Date: 04/08/2019 CLINICAL DATA:  Pain following fall EXAM: CT HEAD WITHOUT CONTRAST CT CERVICAL SPINE WITHOUT CONTRAST TECHNIQUE: Multidetector CT imaging of the head and cervical spine was performed following the standard protocol without intravenous contrast. Multiplanar CT image reconstructions of the cervical spine were also generated. COMPARISON:  None. FINDINGS: CT HEAD FINDINGS Brain: There is age related volume loss. Prominence of the cisterna magna is an apparent anatomic variant. There is no evident intracranial mass hemorrhage, extra-axial fluid collection, or midline shift. There is patchy small vessel disease in the centra semiovale bilaterally. No acute infarct evident. Vascular: No hyperdense vessel. No appreciable vascular calcification evident. Skull: The bony calvarium appears intact. There is a small right frontal scalp hematoma. Sinuses/Orbits: There is mild mucosal thickening in several ethmoid air cells. There is a retention cyst in the inferior right maxillary antrum. Other paranasal sinuses are clear. Orbits appear symmetric bilaterally. Other: Mastoid air cells are clear. There is debris in each external auditory canal. CT CERVICAL SPINE FINDINGS Alignment: There is no evident spondylolisthesis. Skull base and vertebrae: Skull base and craniocervical junction regions appear normal. No fracture evident. There is lucency in the  posterior right first rib with cortical thinning in this area. No similar changes elsewhere. Soft tissues and spinal canal: Prevertebral soft tissues and predental space regions are normal. No cord or canal hematoma evident. No paraspinous lesions. Disc levels: There is slight disc space narrowing posteriorly C5-6. Other disc spaces appear unremarkable. There is central disc bulging at C2-3 which abuts but does not efface the cord. There is multilevel facet hypertrophy. There is no frank disc extrusion or stenosis. Note that there is exit foraminal narrowing due to bony hypertrophy at C5-6 bilaterally due to bony hypertrophy. Upper chest: Visualized upper lung regions are clear. Other: There is slight calcification in the right carotid artery. IMPRESSION: CT head: 1. Age related volume loss with patchy periventricular small vessel disease. No acute infarct. No mass or hemorrhage. 2.  Small right frontal scalp hematoma.  No evident fracture. 3.  Foci of paranasal sinus disease. 4.  Probable cerumen in each external auditory canal. CT cervical spine: 1.  No fracture or spondylolisthesis. 2. 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 No frank disc extrusion or stenosis. 3. Lucency involving a portion of the posterior right first rib with cortical thinning. This appearance potentially may be indicative lytic phase of Paget's disease. Neoplastic change in this bone is a differential consideration. Given this finding, it may be prudent to correlate nonemergently with nuclear medicine bone scan to assess for potential other areas of similar abnormality. Electronically Signed   By: Bretta Bang III M.D.   On: 04/08/2019 08:34   CT Cervical Spine Wo Contrast  Result Date: 04/08/2019 CLINICAL DATA:  Pain following fall EXAM: CT HEAD WITHOUT CONTRAST CT CERVICAL SPINE WITHOUT CONTRAST TECHNIQUE: Multidetector CT imaging of the head and cervical spine was performed following the standard protocol without intravenous  contrast. Multiplanar CT image reconstructions of the cervical spine were also generated. COMPARISON:  None. FINDINGS: CT HEAD FINDINGS Brain: There is age related volume loss. Prominence of the cisterna magna is an apparent anatomic variant. There is no evident intracranial mass hemorrhage, extra-axial fluid collection, or midline shift. There is patchy small vessel disease in the centra semiovale bilaterally. No acute infarct evident. Vascular: No hyperdense vessel. No appreciable vascular calcification evident. Skull: The bony calvarium appears intact. There is a small right frontal scalp hematoma. Sinuses/Orbits: There is mild mucosal thickening in several ethmoid air cells. There is a  retention cyst in the inferior right maxillary antrum. Other paranasal sinuses are clear. Orbits appear symmetric bilaterally. Other: Mastoid air cells are clear. There is debris in each external auditory canal. CT CERVICAL SPINE FINDINGS Alignment: There is no evident spondylolisthesis. Skull base and vertebrae: Skull base and craniocervical junction regions appear normal. No fracture evident. There is lucency in the posterior right first rib with cortical thinning in this area. No similar changes elsewhere. Soft tissues and spinal canal: Prevertebral soft tissues and predental space regions are normal. No cord or canal hematoma evident. No paraspinous lesions. Disc levels: There is slight disc space narrowing posteriorly C5-6. Other disc spaces appear unremarkable. There is central disc bulging at C2-3 which abuts but does not efface the cord. There is multilevel facet hypertrophy. There is no frank disc extrusion or stenosis. Note that there is exit foraminal narrowing due to bony hypertrophy at C5-6 bilaterally due to bony hypertrophy. Upper chest: Visualized upper lung regions are clear. Other: There is slight calcification in the right carotid artery. IMPRESSION: CT head: 1. Age related volume loss with patchy  periventricular small vessel disease. No acute infarct. No mass or hemorrhage. 2.  Small right frontal scalp hematoma.  No evident fracture. 3.  Foci of paranasal sinus disease. 4.  Probable cerumen in each external auditory canal. CT cervical spine: 1.  No fracture or spondylolisthesis. 2. 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 No frank disc extrusion or stenosis. 3. Lucency involving a portion of the posterior right first rib with cortical thinning. This appearance potentially may be indicative lytic phase of Paget's disease. Neoplastic change in this bone is a differential consideration. Given this finding, it may be prudent to correlate nonemergently with nuclear medicine bone scan to assess for potential other areas of similar abnormality. Electronically Signed   By: Bretta BangWilliam  Woodruff III M.D.   On: 04/08/2019 08:34    Procedures .Critical Care Performed by: Milagros Lollykstra, Sreshta Cressler S, MD Authorized by: Milagros Lollykstra, Council Munguia S, MD   Critical care provider statement:    Critical care time (minutes):  35   Critical care was necessary to treat or prevent imminent or life-threatening deterioration of the following conditions:  CNS failure or compromise and cardiac failure   Critical care was time spent personally by me on the following activities:  Discussions with consultants, evaluation of patient's response to treatment, examination of patient, ordering and performing treatments and interventions, ordering and review of laboratory studies, ordering and review of radiographic studies, pulse oximetry, re-evaluation of patient's condition, obtaining history from patient or surrogate and review of old charts   (including critical care time)  Medications Ordered in ED Medications  furosemide (LASIX) tablet 40 mg (has no administration in time range)  metoprolol succinate (TOPROL-XL) 24 hr tablet 25 mg (has no administration in time range)  ferrous sulfate tablet 325 mg (has no administration in time range)   heparin injection 5,000 Units (has no administration in time range)  LORazepam (ATIVAN) injection 1 mg (has no administration in time range)  atorvastatin (LIPITOR) tablet 80 mg (has no administration in time range)    ED Course  I have reviewed the triage vital signs and the nursing notes.  Pertinent labs & imaging results that were available during my care of the patient were reviewed by me and considered in my medical decision making (see chart for details).  Clinical Course as of Apr 07 1610  Sun Apr 08, 2019  [id number] Discussed case with patient's daughter, obtain additional history, provided  update on patient's current status   [RD]    Clinical Course User Index [RD] Milagros Loll, MD   MDM Rules/Calculators/A&P                      79 year old male A. fib presents to ER after syncope versus seizure episode, receiving chest compressions by family due to unresponsiveness.  Here patient noted to be mildly confused but no focal deficits were appreciated on exam.  His vital signs were stable.  CT head was negative.  CXR concerning for possible rib fractures.  Patient complaining of some chest pain, suspect this was related to receiving compressions.  His troponin was mildly elevated, no acute ischemic EKG changes, lower suspicion for ACS.  Discussed with neuro, Aroor, less likely seizures, can follow-up with neurology outpatient.  Given syncope with prolonged period of unresponsiveness, believe patient would benefit from admission for further observation, work-up of syncope, troponin trending, echo.  Discussed with internal medicine residents who will admit to Dr. Neita Garnet service.  Final Clinical Impression(s) / ED Diagnoses Final diagnoses:  Syncope, unspecified syncope type  Episode of unresponsiveness    Rx / DC Orders ED Discharge Orders    None       Milagros Loll, MD 04/08/19 1613    Milagros Loll, MD 04/27/19 [phone]

## 2019-04-09 ENCOUNTER — Observation Stay (HOSPITAL_COMMUNITY): Payer: Medicare Other

## 2019-04-09 ENCOUNTER — Other Ambulatory Visit (HOSPITAL_COMMUNITY): Payer: Medicare HMO

## 2019-04-09 ENCOUNTER — Ambulatory Visit (HOSPITAL_COMMUNITY): Payer: Medicare Other

## 2019-04-09 ENCOUNTER — Encounter (HOSPITAL_COMMUNITY): Payer: Self-pay | Admitting: Internal Medicine

## 2019-04-09 DIAGNOSIS — Z79899 Other long term (current) drug therapy: Secondary | ICD-10-CM | POA: Diagnosis not present

## 2019-04-09 DIAGNOSIS — I4891 Unspecified atrial fibrillation: Secondary | ICD-10-CM | POA: Diagnosis not present

## 2019-04-09 DIAGNOSIS — M6281 Muscle weakness (generalized): Secondary | ICD-10-CM | POA: Diagnosis not present

## 2019-04-09 DIAGNOSIS — G9389 Other specified disorders of brain: Secondary | ICD-10-CM | POA: Diagnosis not present

## 2019-04-09 DIAGNOSIS — Z7901 Long term (current) use of anticoagulants: Secondary | ICD-10-CM | POA: Diagnosis not present

## 2019-04-09 DIAGNOSIS — S0003XA Contusion of scalp, initial encounter: Secondary | ICD-10-CM | POA: Diagnosis not present

## 2019-04-09 DIAGNOSIS — F0390 Unspecified dementia without behavioral disturbance: Secondary | ICD-10-CM | POA: Diagnosis not present

## 2019-04-09 DIAGNOSIS — Z20822 Contact with and (suspected) exposure to covid-19: Secondary | ICD-10-CM | POA: Diagnosis not present

## 2019-04-09 DIAGNOSIS — R569 Unspecified convulsions: Secondary | ICD-10-CM

## 2019-04-09 DIAGNOSIS — R918 Other nonspecific abnormal finding of lung field: Secondary | ICD-10-CM

## 2019-04-09 DIAGNOSIS — R778 Other specified abnormalities of plasma proteins: Secondary | ICD-10-CM | POA: Diagnosis not present

## 2019-04-09 DIAGNOSIS — W1830XA Fall on same level, unspecified, initial encounter: Secondary | ICD-10-CM | POA: Diagnosis not present

## 2019-04-09 DIAGNOSIS — Z8673 Personal history of transient ischemic attack (TIA), and cerebral infarction without residual deficits: Secondary | ICD-10-CM | POA: Diagnosis not present

## 2019-04-09 DIAGNOSIS — G40009 Localization-related (focal) (partial) idiopathic epilepsy and epileptic syndromes with seizures of localized onset, not intractable, without status epilepticus: Secondary | ICD-10-CM

## 2019-04-09 DIAGNOSIS — R55 Syncope and collapse: Secondary | ICD-10-CM

## 2019-04-09 DIAGNOSIS — R32 Unspecified urinary incontinence: Secondary | ICD-10-CM | POA: Diagnosis not present

## 2019-04-09 DIAGNOSIS — Y92009 Unspecified place in unspecified non-institutional (private) residence as the place of occurrence of the external cause: Secondary | ICD-10-CM | POA: Diagnosis not present

## 2019-04-09 DIAGNOSIS — I4510 Unspecified right bundle-branch block: Secondary | ICD-10-CM | POA: Diagnosis not present

## 2019-04-09 LAB — URINALYSIS, ROUTINE W REFLEX MICROSCOPIC
Bilirubin Urine: NEGATIVE
Glucose, UA: NEGATIVE mg/dL
Ketones, ur: NEGATIVE mg/dL
Nitrite: NEGATIVE
Protein, ur: 30 mg/dL — AB
Specific Gravity, Urine: 1.018 (ref 1.005–1.030)
pH: 5 (ref 5.0–8.0)

## 2019-04-09 LAB — CBC
HCT: 34 % — ABNORMAL LOW (ref 39.0–52.0)
Hemoglobin: 11.2 g/dL — ABNORMAL LOW (ref 13.0–17.0)
MCH: 28.2 pg (ref 26.0–34.0)
MCHC: 32.9 g/dL (ref 30.0–36.0)
MCV: 85.6 fL (ref 80.0–100.0)
Platelets: 188 10*3/uL (ref 150–400)
RBC: 3.97 MIL/uL — ABNORMAL LOW (ref 4.22–5.81)
RDW: 16.1 % — ABNORMAL HIGH (ref 11.5–15.5)
WBC: 5.3 10*3/uL (ref 4.0–10.5)
nRBC: 0 % (ref 0.0–0.2)

## 2019-04-09 LAB — BASIC METABOLIC PANEL
Anion gap: 9 (ref 5–15)
BUN: 16 mg/dL (ref 8–23)
CO2: 27 mmol/L (ref 22–32)
Calcium: 8.4 mg/dL — ABNORMAL LOW (ref 8.9–10.3)
Chloride: 105 mmol/L (ref 98–111)
Creatinine, Ser: 1.19 mg/dL (ref 0.61–1.24)
GFR calc Af Amer: >60 mL/min (ref >60)
GFR calc non Af Amer: 58 mL/min — ABNORMAL LOW (ref >60)
Glucose, Bld: 124 mg/dL — ABNORMAL HIGH (ref 70–99)
Potassium: 3.3 mmol/L — ABNORMAL LOW (ref 3.5–5.1)
Sodium: 141 mmol/L (ref 135–145)

## 2019-04-09 LAB — HEPARIN LEVEL (UNFRACTIONATED)
Heparin Unfractionated: 0.47 IU/mL (ref 0.30–0.70)
Heparin Unfractionated: 0.82 IU/mL — ABNORMAL HIGH (ref 0.30–0.70)
Heparin Unfractionated: 0.63 IU/mL (ref 0.30–0.70)

## 2019-04-09 LAB — APTT
aPTT: 82 seconds — ABNORMAL HIGH (ref 24–36)
aPTT: 92 seconds — ABNORMAL HIGH (ref 24–36)

## 2019-04-09 MED ORDER — LEVETIRACETAM IN NACL 1000 MG/100ML IV SOLN
1000.0000 mg | Freq: Once | INTRAVENOUS | Status: AC
  Administered 2019-04-09: 1000 mg via INTRAVENOUS
  Filled 2019-04-09: qty 100

## 2019-04-09 MED ORDER — POTASSIUM CHLORIDE CRYS ER 20 MEQ PO TBCR
30.0000 meq | EXTENDED_RELEASE_TABLET | Freq: Four times a day (QID) | ORAL | Status: AC
  Administered 2019-04-09 (×2): 30 meq via ORAL
  Filled 2019-04-09 (×2): qty 1

## 2019-04-09 MED ORDER — LEVETIRACETAM 500 MG PO TABS
500.0000 mg | ORAL_TABLET | Freq: Two times a day (BID) | ORAL | Status: DC
  Administered 2019-04-09 – 2019-04-10 (×2): 500 mg via ORAL
  Filled 2019-04-09 (×2): qty 1
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
## 2019-04-09 NOTE — Progress Notes (Signed)
ANTICOAGULATION CONSULT NOTE - Follow Up Consult  Pharmacy Consult for heparin Indication: atrial fibrillation  Labs: Recent Labs    04/08/19 0750 04/08/19 0937 04/08/19 1551 04/09/19 0607  HGB 12.2*  --  11.4* 11.2*  HCT 37.1*  --  35.6* 34.0*  PLT 233  --  222 188  APTT  --   --   --  82*  HEPARINUNFRC  --   --   --  0.47  CREATININE 1.10  --  0.99 1.19  TROPONINIHS 86* 207*  --   --     Assessment/Plan:  79yo male therapeutic on heparin with initial dosing while Xarelto on hold. Will continue gtt at current rate and confirm stable with additional level.   Vernard Gambles, PharmD, BCPS  04/09/2019,6:57 AM

## 2019-04-09 NOTE — Progress Notes (Addendum)
MRI reveals no acute intracranial abnormality. 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   EEG pending.   A/R: 1. Initiation of an anticonvulsant is indicated. A load of Keppra 1000 mg has been ordered, followed by 500 mg PO BID.  2. Awaiting EEG results.   Addendum: EEG report conclusions: This study showed evidence of epileptogenicity and cortical dysfunction in right frontotemporal region likely secondary to underlying encephalomalacia. No seizures were seen throughout the recording.  Electronically signed: Dr. Caryl Pina

## 2019-04-09 NOTE — Progress Notes (Signed)
Carotid artery duplex completed.  04/09/2019 10:02 AM Eula Fried., MHA, RVT, RDCS, RDMS

## 2019-04-09 NOTE — TOC Initial Note (Signed)
Transition of Care [facility]) - Initial/Assessment Note    Patient Details  Name: Brandon Mata MRN: [id number] Date of Birth: 28-May-1940  Transition of [facility]) CM/SW Contact:    Kingsley Plan, RN Phone Number: 04/09/2019, 12:02 PM  Clinical Narrative:                 Patient from home with daughter and brother , 24 hour supervision. Will continue to follow.   Expected Discharge Plan: Home/Self Care     Patient Goals and CMS Choice Patient states their goals for this hospitalization and ongoing recovery are:: to go home CMS Medicare.gov Compare Post Acute Care list provided to:: Patient Choice offered to / list presented to : NA  Expected Discharge Plan and Services Expected Discharge Plan: Home/Self Care       Living arrangements for the past 2 months: Single Family Home                 DME Arranged: N/A         HH Arranged: NA          Prior Living Arrangements/Services Living arrangements for the past 2 months: Single Family Home Lives with:: Adult Children, Siblings Patient language and need for interpreter reviewed:: Yes Do you feel safe going back to the place where you live?: Yes      Need for Family Participation in Patient Care: Yes (Comment) Care giver support system in place?: Yes (comment)   Criminal Activity/Legal Involvement Pertinent to Current Situation/Hospitalization: No - Comment as needed  Activities of Daily Living Home Assistive Devices/Equipment: Cane (specify quad or straight)("walking stick") ADL Screening (condition at time of admission) Patient's cognitive ability adequate to safely complete daily activities?: Yes Is the patient deaf or have difficulty hearing?: Yes Does the patient have difficulty seeing, even when wearing glasses/contacts?: Yes Does the patient have difficulty concentrating, remembering, or making decisions?: No Patient able to express need for assistance with ADLs?: Yes Does the patient have difficulty  dressing or bathing?: No Independently performs ADLs?: Yes (appropriate for developmental age) Does the patient have difficulty walking or climbing stairs?: No Weakness of Legs: None Weakness of Arms/Hands: None  Permission Sought/Granted   Permission granted to share information with : No              Emotional Assessment Appearance:: Appears stated age Attitude/Demeanor/Rapport: Engaged Affect (typically observed): Accepting Orientation: : Oriented to Self, Oriented to Place, Oriented to  Time, Oriented to Situation      Admission diagnosis:  Syncope [R55] Pain [R52] Episode of unresponsiveness [R41.89] Syncope, unspecified syncope type [R55] Patient Active Problem List   Diagnosis Date Noted  . Syncope 04/08/2019  . Total cataract of both eyes 01/11/2017  . Atrial fibrillation, chronic (HCC) 04/15/2015   PCP:  [facility]:   [facility] [place], [place] - [place], [place] - [address] [place] [address] Phone: [phone] Fax: [phone]     Social Determinants of Health (SDOH) Interventions    Readmission Risk Interventions No flowsheet data found.

## 2019-04-09 NOTE — Care Management Obs Status (Signed)
MEDICARE OBSERVATION STATUS NOTIFICATION   Patient Details  Name: Brandon Mata MRN: [id number] Date of Birth: [birth year]   Medicare Observation Status Notification Given:  Yes    Kingsley Plan, RN 04/09/2019, 11:58 AM

## 2019-04-09 NOTE — Progress Notes (Addendum)
ANTICOAGULATION CONSULT NOTE - Initial Consult  Pharmacy Consult for heparin  Indication: Afib (CHADS2VASc = 5)  No Known Allergies  Patient Measurements: Height: 5\' 5"  (165.1 cm) Weight: 194 lb 7.1 oz (88.2 kg) IBW/kg (Calculated) : 61.5 Heparin Dosing Weight: 80kg  Vital Signs: Temp: 98.9 F (37.2 C) (01/11 1222) Temp Source: Oral (01/11 1222) BP: 93/51 (01/11 1222) Pulse Rate: 85 (01/11 1222)  Labs: Recent Labs    04/08/19 0750 04/08/19 0937 04/08/19 1551 04/09/19 0607 04/09/19 1205  HGB 12.2*  --  11.4* 11.2*  --   HCT 37.1*  --  35.6* 34.0*  --   PLT 233  --  222 188  --   APTT  --   --   --  82* 92*  HEPARINUNFRC  --   --   --  0.47 0.82*  CREATININE 1.10  --  0.99 1.19  --   TROPONINIHS 86* 207*  --   --   --     Estimated Creatinine Clearance: 52.2 mL/min (by C-G formula based on SCr of 1.19 mg/dL).  Assessment: 79yo male on rivaroxaban at home for  Afib (CHADS2VASc = 5), now on heparin while rivaroxaban on hold. Last dose of rivaroxaban was 1/9 AM.  Heparin level supratherapeutic and trended up unexpectedly after last level. Per RN, no issues with infusion, no bleeding other than when patient pulled out other PIV. Heparin is running on right arm PIV. RN was not sure which arm the HL was drawn from but if sample was contaminated with drug would expect a higher HL. Patient received 2 doses of subcutaneous heparin last night before infusion started but would not expect those to have an effect now as previous level was therapeutic. APTT also on higher end.  Note pt has large RUE hematoma and small R scalp hematoma on imaging.   Will switch to heparin level monitoring and decrease rate. H/H & plt stable.   Goal of Therapy:  Heparin level 0.3-0.7 units/ml Monitor platelets by anticoagulation protocol: Yes   Plan:  Decrease heparin to 1000 units/hr F/u 6 hr HL F/u Daily HL, CBC, s/sx bleeding  Switch to rivaroxaban when stable   3/9, PharmD, BCPS,  [facility] Clinical Pharmacist  Please check AMION for all [facility]acy phone numbers After 10:00 PM, call Main Pharmacy [phone]

## 2019-04-09 NOTE — Procedures (Signed)
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 EEG to evaluate for seizure.  Level of alertness: awake  AEDs during EEG study: Keppra  Technical aspects: This EEG study was done with scalp electrodes positioned according to the 10-20 International system of electrode placement. Electrical activity was acquired at a sampling rate of 500Hz  and reviewed with a high frequency filter of 70Hz  and a low frequency filter of 1Hz . EEG data were recorded continuously and digitally stored.   DESCRIPTION: During awake state, no clear posterior dominant rhythm was seen.  EEG showed frequent right frontotemporal spikes (maximal F8/FP2).  Continuous 3 to 5 Hz theta-delta slowing was also seen in left frontotemporal region.  Hyperventilation and photic stimulation were not performed.  Abnormality -Continuous slow, left frontotemporal region -Spikes, left frontotemporal (maximal F8, Fp2)  IMPRESSION: This study showed evidence of epileptogenicity and cortical dysfunction in right frontotemporal region likely secondary to underlying encephalomalacia. No seizures were seen throughout the recording.    [name] 
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
## 2019-04-09 NOTE — Progress Notes (Signed)
ANTICOAGULATION CONSULT NOTE - Follow Up Consult  Pharmacy Consult for Heparin Indication: atrial fibrillation  No Known Allergies  Patient Measurements: Height: 5\' 5"  (165.1 cm) Weight: 194 lb 7.1 oz (88.2 kg) IBW/kg (Calculated) : 61.5 Heparin Dosing Weight:  80 kg  Vital Signs: Temp: 97.5 F (36.4 C) (01/11 1817) Temp Source: Oral (01/11 1817) BP: 139/85 (01/11 1817) Pulse Rate: 80 (01/11 1817)  Labs: Recent Labs    04/08/19 0750 04/08/19 0937 04/08/19 1551 04/09/19 0607 04/09/19 1205 04/09/19 2109  HGB 12.2*  --  11.4* 11.2*  --   --   HCT 37.1*  --  35.6* 34.0*  --   --   PLT 233  --  222 188  --   --   APTT  --   --   --  82* 92*  --   HEPARINUNFRC  --   --   --  0.47 0.82* 0.63  CREATININE 1.10  --  0.99 1.19  --   --   TROPONINIHS 86* 207*  --   --   --   --     Estimated Creatinine Clearance: 52.2 mL/min (by C-G formula based on SCr of 1.19 mg/dL).   Assessment:  Anticoag: large hematoma RUE, small R scalp hematoma from fall. Hep for Afib (CHADS2VASc = 5) Xarelto PTA, LD 1/9 AM. - HL 0.63 in goal.  Goal of Therapy:  Heparin level 0.3-0.7 units/ml Monitor platelets by anticoagulation protocol: Yes   Plan:  Con't heparin at 1000 units/hr Daily HL and CBC   Roanne Haye S. 3/9, PharmD, BCPS Clinical Staff Pharmacist [email or web address] Merilynn Finland, Merilynn Finland 04/09/2019,10:57 PM

## 2019-04-09 NOTE — Progress Notes (Signed)
Subjective:  Evaluated patient at bedside during rounds this morning. Patient complains of continued chest soreness but otherwise denies any complaints at this time. History is limited due to dementia and patient's daughter not at bedside this morning.   Objective:  Vital signs in last 24 hours: Vitals:   04/08/19 1900 04/08/19 1930 04/08/19 2029 04/09/19 0554  BP: 113/74 (!) 113/56 122/73 139/82  Pulse:  82 80 81  Resp: (!) 21 17 20 20   Temp:   99.6 F (37.6 C) 99 F (37.2 C)  TempSrc:   Oral Oral  SpO2:  95% 98% 97%  Weight:   88.2 kg   Height:   5\' 5"  (1.651 m)    Weight change:   Intake/Output Summary (Last 24 hours) at 04/09/2019 1035 Last data filed at 04/09/2019 0559 Gross per 24 hour  Intake 24.33 ml  Output 1510 ml  Net -1485.67 ml   Physical Exam  Constitutional: He is oriented to person, place, and time. No distress.  HENT:  Head: Normocephalic.  4 x 4 ecchymosis and swelling present on the right forehead  Eyes: Pupils are equal, round, and reactive to light. EOM are normal.  Bilateral cataracts  Cardiovascular: Normal rate and intact distal pulses. An irregular rhythm present.  Pulmonary/Chest: Effort normal and breath sounds normal. No respiratory distress. He has no wheezes. He exhibits tenderness.  Abdominal: Soft. Bowel sounds are normal. There is no abdominal tenderness.  Musculoskeletal:        General: Normal range of motion.     Left forearm: Swelling and tenderness present.     Cervical back: Normal range of motion and neck supple.  Neurological: He is alert and oriented to person, place, and time.  Skin: Skin is warm and dry.  Nursing note and vitals reviewed.  Assessment/Plan:  In summary, Mr. Oommen is a 79 year old male with a past medical history of A. fib on Xarelto and CVA 9 years ago who presented after syncopal event. He reportedly had 10 minutes of uncontrolled shaking of his right upper extremity and bilateral lower extremities as well  as bladder incontinence. Differential diagnosis includes seizure versus arrhythmia versus vasovagal syncope.  Syncope: Seizure versus arrhythmia versus vasovagal syncope versus orthostatic hypotension. Although the patient had bladder incontinence, he reportedly was trying to use the restroom before the episode started.  It is unclear if he was incontinent prior to passing out or afterwards.  Patient also has a history of "blacking out" when he tries to stand up too fast. Troponins were also elevated on admission cardiac events or arrhythmia must also be worked up. TTE showed LVEF of 60-65% but no identifiable thrombus. MRI with no intracranial abnormality.  - Cardiology following, appreciate recommendations - Neurology following, appreciate recommendations - Telemetry  - F/u EEG - F/u carotid dopplers - Orthostatic vitals - Ativan prn  Chest pain: Troponins elevated and up trended from 86-207, concerning for NSTEMI. However, this could also be secondary to chest trauma within the context of CPR as pain is exacerbated with palpation. Lipid panel significant for LDL elevated to 120. 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 Cardiology is aware and following.  - Cardiology following, will appreciate recommendations - Metoprolol 25 mg daily - Atorvastatin 80 mg daily   A-fib: Patient takes Xarelto 20 mg daily. Rate well controlled with low dose beta blocker. 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 However, TEE yesterday revealed preserved ejection fraction of 60-65% with mildly increased left ventricular hypertrophy. Patient takes Lasix 40 mg qd at home. 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    - Consider nuclear medicine bone scan to assess for potential other areas of similar abnormality.  FEN/GI  - Diet: 2 g sodium diet  - Fluids: None  DVT prophylaxis: Patient typically takes Xarelto 20 mg daily at home for A fib; we will hold this for now.  - Heparin subq injections 5000 units every 8 hours    LOS: 0 days   Bebe Liter, MS3 04/09/2019, 10:35 AM

## 2019-04-10 DIAGNOSIS — R079 Chest pain, unspecified: Secondary | ICD-10-CM

## 2019-04-10 DIAGNOSIS — I5030 Unspecified diastolic (congestive) heart failure: Secondary | ICD-10-CM

## 2019-04-10 DIAGNOSIS — I4891 Unspecified atrial fibrillation: Secondary | ICD-10-CM

## 2019-04-10 DIAGNOSIS — F0390 Unspecified dementia without behavioral disturbance: Secondary | ICD-10-CM

## 2019-04-10 DIAGNOSIS — Z7901 Long term (current) use of anticoagulants: Secondary | ICD-10-CM

## 2019-04-10 DIAGNOSIS — I452 Bifascicular block: Secondary | ICD-10-CM

## 2019-04-10 DIAGNOSIS — H269 Unspecified cataract: Secondary | ICD-10-CM

## 2019-04-10 DIAGNOSIS — R32 Unspecified urinary incontinence: Secondary | ICD-10-CM | POA: Diagnosis not present

## 2019-04-10 DIAGNOSIS — R569 Unspecified convulsions: Secondary | ICD-10-CM | POA: Diagnosis not present

## 2019-04-10 DIAGNOSIS — Z8673 Personal history of transient ischemic attack (TIA), and cerebral infarction without residual deficits: Secondary | ICD-10-CM

## 2019-04-10 DIAGNOSIS — Z79899 Other long term (current) drug therapy: Secondary | ICD-10-CM

## 2019-04-10 DIAGNOSIS — R55 Syncope and collapse: Secondary | ICD-10-CM | POA: Diagnosis not present

## 2019-04-10 LAB — BASIC METABOLIC PANEL
Anion gap: 4 — ABNORMAL LOW (ref 5–15)
BUN: 17 mg/dL (ref 8–23)
CO2: 29 mmol/L (ref 22–32)
Calcium: 8.2 mg/dL — ABNORMAL LOW (ref 8.9–10.3)
Chloride: 108 mmol/L (ref 98–111)
Creatinine, Ser: 0.92 mg/dL (ref 0.61–1.24)
GFR calc Af Amer: >60 mL/min (ref >60)
GFR calc non Af Amer: >60 mL/min (ref >60)
Glucose, Bld: 105 mg/dL — ABNORMAL HIGH (ref 70–99)
Potassium: 3.8 mmol/L (ref 3.5–5.1)
Sodium: 141 mmol/L (ref 135–145)

## 2019-04-10 LAB — CBC
HCT: 33.1 % — ABNORMAL LOW (ref 39.0–52.0)
Hemoglobin: 10.8 g/dL — ABNORMAL LOW (ref 13.0–17.0)
MCH: 28.5 pg (ref 26.0–34.0)
MCHC: 32.6 g/dL (ref 30.0–36.0)
MCV: 87.3 fL (ref 80.0–100.0)
Platelets: 190 10*3/uL (ref 150–400)
RBC: 3.79 MIL/uL — ABNORMAL LOW (ref 4.22–5.81)
RDW: 16.5 % — ABNORMAL HIGH (ref 11.5–15.5)
WBC: 5.1 10*3/uL (ref 4.0–10.5)
nRBC: 0 % (ref 0.0–0.2)

## 2019-04-10 LAB — HEPARIN LEVEL (UNFRACTIONATED): Heparin Unfractionated: 0.49 IU/mL (ref 0.30–0.70)

## 2019-04-10 MED ORDER — RIVAROXABAN 20 MG PO TABS
20.0000 mg | ORAL_TABLET | Freq: Every day | ORAL | Status: DC
  Administered 2019-04-10: 20 mg via ORAL
  Filled 2019-04-10: qty 1

## 2019-04-10 MED ORDER — LEVETIRACETAM 500 MG PO TABS: 500.0000 mg | ORAL_TABLET | Freq: Two times a day (BID) | ORAL | 3 refills | Status: AC

## 2019-04-10 MED ORDER — ATORVASTATIN CALCIUM 80 MG PO TABS: 40.0000 mg | ORAL_TABLET | Freq: Every day | ORAL | 3 refills | Status: AC

## 2019-04-10 NOTE — Progress Notes (Signed)
Subjective:  Evaluated patient at bedside during rounds this morning. Patient complains of continued chest soreness but otherwise denies any complaints at this time. History is limited due to dementia and patient's daughter not at bedside this morning.   Objective:  Vital signs in last 24 hours: Vitals:   04/09/19 1222 04/09/19 1817 04/09/19 2357 04/10/19 0537  BP: (!) 93/51 139/85 139/79 131/82  Pulse: 85 80 79 72  Resp: 18 18 18 20   Temp: 98.9 F (37.2 C) (!) 97.5 F (36.4 C) 98.2 F (36.8 C) 98 F (36.7 C)  TempSrc: Oral Oral Oral Axillary  SpO2: 100% 99% 98% 97%  Weight:      Height:       Weight change:   Intake/Output Summary (Last 24 hours) at 04/10/2019 1004 Last data filed at 04/10/2019 0601 Gross per 24 hour  Intake 467.24 ml  Output 750 ml  Net -282.76 ml   Physical Exam  Constitutional: He is oriented to person, place, and time. No distress.  HENT:  Head: Normocephalic.  4 x 4 ecchymosis and swelling present on the right forehead  Eyes: Pupils are equal, round, and reactive to light. EOM are normal.  Bilateral cataracts  Cardiovascular: Normal rate and intact distal pulses. An irregular rhythm present.  Pulmonary/Chest: Effort normal and breath sounds normal. No respiratory distress. He has no wheezes. He exhibits tenderness.  Abdominal: Soft. Bowel sounds are normal. There is no abdominal tenderness.  Musculoskeletal:        General: Normal range of motion.     Left forearm: Swelling and tenderness present.     Cervical back: Normal range of motion and neck supple.  Neurological: He is alert and oriented to person, place, and time.  Skin: Skin is warm and dry.  Nursing note and vitals reviewed.  Assessment/Plan:  In summary, Brandon Mata is a 79 year old male with a past medical history of A. fib on Xarelto and CVA 9 years ago who presented after syncopal event. He reportedly had 10 minutes of uncontrolled shaking of his right upper extremity and  bilateral lower extremities as well as bladder incontinence. Differential diagnosis includes seizure versus arrhythmia versus vasovagal syncope.  Syncope: Seizure versus arrhythmia versus vasovagal syncope versus orthostatic hypotension. Although the patient had bladder incontinence, he reportedly was trying to use the restroom before the episode started.  It is unclear if he was incontinent prior to passing out or afterwards.  Patient also has a history of "blacking out" when he tries to stand up too fast. Troponins were also elevated on admission cardiac events or arrhythmia must also be worked up. TTE showed LVEF of 60-65% but no identifiable thrombus. Carotid dopplers unremarkable. MRI with no intracranial abnormality. However, EEG showed evidence of epileptogenicity and cortical dysfunction in right frontotemporal region likely secondary to underlying encephalomalacia. No seizures were seen throughout the recording. Neurology to continue to evaluate as outpatient. - Cardiology following, appreciate recommendations - Neurology following, will follow-up as outpatient - Telemetry - Orthostatic vitals - Ativan prn  Chest pain: Troponins elevated and up trended from 86-207, concerning for NSTEMI. However, this could also be secondary to chest trauma within the context of CPR as pain is exacerbated with palpation. Lipid panel significant for LDL elevated to 120. 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 Cardiology is aware and following.  - Cardiology following, will appreciate recommendations - Metoprolol 25 mg daily - Atorvastatin 80 mg daily  A-fib: Patient takes Xarelto 20 mg daily. Rate well controlled  with low dose beta blocker. 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 However, TEE yesterday revealed preserved ejection fraction of 60-65% with  mildly increased left ventricular hypertrophy. Patient takes Lasix 40 mg qd at home. 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   - Consider nuclear medicine bone scan to assess for potential other areas of similar abnormality.  FEN/GI  - Diet: 2 g sodium diet  - Fluids: None  DVT prophylaxis: Patient typically takes Xarelto 20 mg daily at home for A fib; we will hold this for now.  - Decrease heparin to 1000 units/hr  - Switch to rivaroxaban when stable     LOS: 1 day   Brandon Mata, MS3 04/09/2019, 10:35 AM

## 2019-04-10 NOTE — Progress Notes (Signed)
RN called pt daughter Torryn Hudspeth and went over discharge instructions with her VIA telephone will also send pt home with discharge packet for her to read over. Pt started on 2 new medications that Alvino Chapel is aware of both medications escribed to pt home pharmacy. Alvino Chapel stated that her and her aunt would be here around lunch time to pick up the patient for discharge. Will have patient dressed and belongings packed by the time of discharge.

## 2019-04-10 NOTE — Progress Notes (Signed)
ANTICOAGULATION CONSULT NOTE - Initial Consult  Pharmacy Consult for heparin  Indication: Afib (CHADS2VASc = 5)  No Known Allergies  Patient Measurements: Height: 5\' 5"  (165.1 cm) Weight: 194 lb 7.1 oz (88.2 kg) IBW/kg (Calculated) : 61.5 Heparin Dosing Weight: 80kg  Vital Signs: Temp: 98 F (36.7 C) (01/12 0537) Temp Source: Axillary (01/12 0537) BP: 131/82 (01/12 0537) Pulse Rate: 72 (01/12 0537)  Labs: Recent Labs    04/08/19 0750 04/08/19 0937 04/08/19 1551 04/08/19 1551 04/09/19 0607 04/09/19 1205 04/09/19 2109 04/10/19 0552  HGB 12.2*  --  11.4*  --  11.2*  --   --  10.8*  HCT 37.1*  --  35.6*  --  34.0*  --   --  33.1*  PLT 233  --  222  --  188  --   --  190  APTT  --   --   --   --  82* 92*  --   --   HEPARINUNFRC  --   --   --    < > 0.47 0.82* 0.63 0.49  CREATININE 1.10  --  0.99  --  1.19  --   --  0.92  TROPONINIHS 86* 207*  --   --   --   --   --   --    < > = values in this interval not displayed.    Estimated Creatinine Clearance: 67.6 mL/min (by C-G formula based on SCr of 0.92 mg/dL).  Assessment: 79yo male on rivaroxaban at home for  Afib (CHADS2VASc = 5), now on heparin while rivaroxaban on hold. Last dose of rivaroxaban was 1/9 AM.  Heparin level therapeutic but still down trending from rate decrease. H/H & plt stable. Will repeat another level in 6hr to make sure does not drop below goal.    Goal of Therapy:  Heparin level 0.3-0.7 units/ml Monitor platelets by anticoagulation protocol: Yes   Plan:  Continue heparin to 1000 units/hr F/u 6 hr HL F/u Daily HL, CBC, s/sx bleeding  Switch to rivaroxaban when stable   3/9, PharmD, BCPS, [facility] Clinical Pharmacist  Please check AMION for all [facility] Pharmacy phone numbers After 10:00 PM, call Main Pharmacy [phone]

## 2019-04-10 NOTE — Discharge Summary (Signed)
Name: Brandon Mata MRN: [id number] DOB: February 01, 1941 79 y.o. PCP: Practice, [facility]  Date of Admission: 04/08/2019  7:10 AM Date of Discharge: 04/10/19 Attending Physician: Gust Rung, DO  Discharge Diagnosis: 1. Syncope secondary to seizure  Discharge Medications: Allergies as of 04/10/2019   No Known Allergies     Medication List    TAKE these medications   atorvastatin 80 MG tablet Commonly known as: LIPITOR Take 0.5 tablets (40 mg total) by mouth daily at 6 PM.   FeroSul 325 (65 FE) MG tablet Generic drug: ferrous sulfate Take 325 mg by mouth daily.   furosemide 40 MG tablet Commonly known as: LASIX Take 40 mg by mouth daily.   levETIRAcetam 500 MG tablet Commonly known as: KEPPRA Take 1 tablet (500 mg total) by mouth 2 (two) times daily.   metoprolol succinate 25 MG 24 hr tablet Commonly known as: TOPROL-XL Take 25 mg by mouth daily.   Xarelto 20 MG Tabs tablet Generic drug: rivaroxaban Take 20 mg by mouth daily.      Disposition and follow-up:   BrandonBrandon Mata was discharged from [facility] in faircondition.  At the hospital follow up visit please address:  1.  Syncope secondary to seizure: EEG notable for epileptogenicity and cortical dysfunction in the right frontotemporal region, likely secondary to underlying encephalomalacia, but no seizures were seen throughout the recording. Neurology loaded the patient with Keppra 1000 mg followed by Keppra 500 mg twice daily. -Please ensure the patient is taking Keppra as prescribed, and following up with neurology in the outpatient setting.  2.  Labs / imaging needed at time of follow-up: None  3.  Pending labs/ test needing follow-up: None  Follow-up Appointments:   Please set up follow-up appointments with your primary care provider and the neurologist within 1 to 2 weeks of being discharged from the hospital.  Hospital Course by problem list 1.  Syncope secondary to  seizure  In summary, [name] is a 79 year old male with a past medical history of A. fib on Xarelto and CVA 9 years ago who presented after syncopal event.  He reportedly had 10 minutes of uncontrolled shaking of his right upper extremity and bilateral lower extremities as well as bladder incontinence. The initial differential diagnosis included seizure versus arrhythmia versus vasovagal syncope.  Cardiology and neurology was consulted during his admission. Patient underwent echocardiogram, carotid ultrasounds and telemetry all of which were normal, and pointed against cardiac arrhythmia.  Neurology performed an EEG and discovered etiopathogenicity and cortical dysfunction in the right frontotemporal region likely secondary to underlying encephalomalacia.  Neurology loaded the patient with Keppra 1000 mg and subsequently prescribed Keppra 500 mg twice daily.  The patient will follow up with neurology in the outpatient setting for ongoing management.  Discharge Vitals:   BP 131/82 (BP Location: Left Arm)   Pulse 72   Temp 98 F (36.7 C) (Axillary)   Resp 20   Ht 5\' 5"  (1.651 m)   Wt 88.2 kg   SpO2 97%   BMI 32.36 kg/m   Pertinent Labs, Studies, and Procedures:  CBC Latest Ref Rng & Units 04/10/2019 04/09/2019 04/08/2019  WBC 4.0 - 10.5 K/uL 5.1 5.3 7.4  Hemoglobin 13.0 - 17.0 g/dL 10.8(L) 11.2(L) 11.4(L)  Hematocrit 39.0 - 52.0 % 33.1(L) 34.0(L) 35.6(L)  Platelets 150 - 400 K/uL 190 188 222   BMP Latest Ref Rng & Units 04/10/2019 04/09/2019 04/08/2019  Glucose 70 - 99 mg/dL 06/06/2019) 502(D) -  BUN 8 - 23 mg/dL 17 16 -  Creatinine 9.03 - 1.24 mg/dL 0.09 2.33 0.07  Sodium 135 - 145 mmol/L 141 141 -  Potassium 3.5 - 5.1 mmol/L 3.8 3.3(L) -  Chloride 98 - 111 mmol/L 108 105 -  CO2 22 - 32 mmol/L 29 27 -  Calcium 8.9 - 10.3 mg/dL 8.2(L) 8.4(L) -   EEG IMPRESSION: This study showed evidence of epileptogenicity and cortical dysfunction in right frontotemporal region likely secondary to underlying  encephalomalacia. No seizures were seen throughout the recording.  Echocardiogram: IMPRESSIONS 1. Left ventricular ejection fraction, by visual estimation, is 60 to 65%. The left ventricle has normal function. There is mildly increased left ventricular hypertrophy. 2. Left ventricular diastolic parameters are indeterminate. 3. Global right ventricle has normal systolic function.The right ventricular size is normal. 4. The mitral valve is normal in structure. Trivial mitral valve regurgitation. 5. The aortic valve was not well visualized. Aortic valve regurgitation is mild. Mild to moderate aortic valve sclerosis/calcification without any evidence of aortic stenosis. 6. The pulmonic valve was grossly normal. Pulmonic valve regurgitation is trivial. 7. The tricuspid valve is normal in structure. 8. There is mild dilatation of the aortic root measuring 62mm. Mild dilatation of the ascending aorta measures 21mm 9. Left atrial size was mildly dilated. 10. The inferior vena cava is normal in size with greater than 50% respiratory variability, suggesting right atrial pressure of 3 mmHg. 11. The tricuspid regurgitant velocity is 2.62 m/s, and with an assumed right atrial pressure of 3 mmHg, the estimated right ventricular systolic pressure is mildly elevated at 30.5 mmHg.  Bilateral Duplex Carotid: Summary: 1. Right Carotid: Velocities in the right ICA are consistent with a 1-39% stenosis. 2. Left Carotid: Velocities in the left ICA are consistent with a 1-39% stenosis. 3. Vertebrals:  Bilateral vertebral arteries demonstrate antegrade flow. Subclavians: Normal flow hemodynamics were seen in bilateral subclavian arteries.  CT Head: 1. Age related volume loss with patchy periventricular small vessel disease. No acute infarct. No mass or hemorrhage. 2.  Small right frontal scalp hematoma.  No evident fracture. 3.  Foci of paranasal sinus disease. 4.  Probable cerumen in each external auditory  canal  CT Cervical Spine: 1.  No fracture or spondylolisthesis. 2. 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 No frank disc extrusion or stenosis. 3. Lucency involving a portion of the posterior right first rib with cortical thinning. This appearance potentially may be indicative lytic phase of Paget's disease. Neoplastic change in this bone is a differential consideration. Given this finding, it may be prudent to correlate nonemergently with nuclear medicine bone scan to assess for potential other areas of similar abnormality.  DG Ribs bilateral w/ chest: IMPRESSION: 1. Cortical irregularity of the lateral right fifth rib and posterolateral right sixth rib may represent age indeterminate nondisplaced fractures. Correlation with point tenderness is Recommended.  DG L forearm: IMPRESSION: Large hematoma in the forearm. No underlying fracture is identified. No gross soft tissue gas. No radiopaque foreign body.  Discharge Instructions: Discharge Instructions    Diet - low sodium heart healthy   Complete by: As directed    Discharge instructions   Complete by: As directed    Brandon Mata,  It was a pleasure taking care of you here in the hospital.  From our evaluation, you had a seizure which had caused you to pass out.  You were seen by the neurologist who started you on a seizure medication called Keppra.  You take 500 mg daily.  I would encourage that you follow-up with the neurologist and your primary care doctor after discharge.  Per [facility] statutes, patients with seizures are not allowed to drive until they have been seizure-free for six months.   Use caution when using heavy equipment or power tools. Avoid working on ladders or at heights. Take showers instead of baths. Ensure the water temperature is not too high on the home water heater. Do not go swimming alone. Do not lock yourself in a room alone (i.e. bathroom). When caring for infants or small children, sit down  when holding, feeding, or changing them to minimize risk of injury to the child in the event you have a seizure. Maintain good sleep hygiene. Avoid alcohol.   If patienthas another seizure, call 911 and bring them back to the ED if: A. The seizure lasts longer than 5 minutes.  B. The patient doesn't wake shortly after the seizure or has new problems such as difficulty seeing, speaking or moving following the seizure C. The patient was injured during the seizure D. The patient has a temperature over 102 F (39C) E. The patient vomited during the seizure and now is having trouble breathing   Increase activity slowly   Complete by: As directed      Signed: Earlene Plater, MD Internal Medicine, PGY1 Pager: [phone]  04/10/2019,10:23 AM

## 2019-04-10 NOTE — Progress Notes (Signed)
Review of Neurology study results:  EEG: This study showed evidence of epileptogenicity and cortical dysfunction in right frontotemporal region likely secondary to underlying encephalomalacia. No seizures were seen throughout the recording.  MRI brain: 1.  No acute intracranial abnormality. 2. Encephalomalacia at the right occipital pole, and seemingly also at the right cervicomedullary junction. But otherwise largely normal for age noncontrast MRI appearance of the brain. 3. Right scalp hematoma.   A/R: 79 year old male with episode of LOC followed by jerking movements of right arm and BLE. Overall presentation most consistent with new-onset seizure.  1. Neurology work up complete, revealing predisposition for seizures on both the MRI and EEG studies.  2. Started on Keppra yesterday.  3. Will need outpatient Neurology follow up.  4. Neurology will sign off. Please call if there are additional questions.   Electronically signed: Dr. Caryl Pina

## 2019-05-03 ENCOUNTER — Other Ambulatory Visit: Payer: Self-pay

## 2019-05-03 ENCOUNTER — Ambulatory Visit: Payer: Medicare HMO | Admitting: Neurology

## 2019-05-03 ENCOUNTER — Encounter: Payer: Self-pay | Admitting: Neurology

## 2019-05-03 VITALS — BP 140/81 | HR 77 | Temp 97.2°F | Ht 64.0 in | Wt 196.0 lb

## 2019-05-03 DIAGNOSIS — I482 Chronic atrial fibrillation, unspecified: Secondary | ICD-10-CM | POA: Diagnosis not present

## 2019-05-03 DIAGNOSIS — I459 Conduction disorder, unspecified: Secondary | ICD-10-CM

## 2019-05-03 DIAGNOSIS — I63431 Cerebral infarction due to embolism of right posterior cerebral artery: Secondary | ICD-10-CM

## 2019-05-03 DIAGNOSIS — R9401 Abnormal electroencephalogram [EEG]: Secondary | ICD-10-CM

## 2019-05-03 DIAGNOSIS — G40009 Localization-related (focal) (partial) idiopathic epilepsy and epileptic syndromes with seizures of localized onset, not intractable, without status epilepticus: Secondary | ICD-10-CM

## 2019-05-03 DIAGNOSIS — H269 Unspecified cataract: Secondary | ICD-10-CM

## 2019-05-03 NOTE — Progress Notes (Signed)
Provider:  Melvyn Novas, M D  Referring Provider: Practice, [facility]* Primary Care Physician:  Practice, [facility]  Chief Complaint  Patient presents with  . New Patient (Initial Visit)    79 year old patient , here  with daughter. Rm 11. On Sunday morning 1/10. he got up from bed to use the bathroom and  " just went out. he said the next thing he remembered EMS".  When he was taken to hospital. A sz was witnessed by the daughter. pt reportedly had never had a SZ before.     HPI:  Brandon Mata is a 79 y.o. male and is seen here upon referral from  [facility] [place] ,by PA Lonie Peak to follow within [facility].  Mr. 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  However his daughter clearly witnessed a seizure.  The episode of loss of consciousness was accompanied by jerking movements of the right arm and bilateral lower extremities, his eyes were open.  But he could not focus and could not respond.  Overall his presentation was that of a new onset seizure.  The patient underwent an EEG recording interpreted by Dr. Lindie Spruce, he had already been started on Keppra in the emergency room but this recording was performed.  During the awake state there was no clear posterior dominant rhythm seen frequent right frontotemporal spikes were noted these are not consistent with the known stroke region.  There was also continues 3 to 5 Hz theta delta slowing in the left frontopolar parietal region, hyperventilation and photic stimulation had to be deferred.  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  The patient is a history with has a history of A. fib which probably led to his stroke in the past.  He has been on Xarelto  and 9 years ago had suffered a stroke.  The daughter also stated that the patient has a history of blackout spells of short.  When he stands up too fast and falls and seems to lose awareness.  In the ED CBC was notable for elevated white blood cells 10.8, slight anemia hematocrit hemoglobin 12.2 CMP was unremarkable troponin was elevated to 86 and subsequently even 207.  Normal urine analysis, CT of the head without fracture or bleed CT of the spine demonstrated some degenerative joint disease overall normal for age.  MRI of the brain showed no acute intracranial abnormality but encephalomalacia at the right occipital pole.  Also at the right cervicomedullary junction.  There was a right-sided scalp hematoma noticed as a result of his fall and hitting his head.  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    Review of Systems: Out of a complete 14 system review, the patient complains of only the following symptoms, and all other reviewed systems are negative.   Social History   Socioeconomic History  . Marital status: Divorced    Spouse name: Not on file  . Number of children: 3 adult children  . Years of education: Not on file  . Highest education level: Not on file  Occupational History  . Not on file  Used to work for [employer].   Tobacco Use  .  Smoking status: Former Research scientist (life sciences)  . Smokeless tobacco: Never Used  . Tobacco comment: quit several years ago  Substance and Sexual Activity  . Alcohol use: Not Currently  . Drug use: Not Currently  . Sexual activity: Not on file  Other Topics Concern  . Not on file  Social History Narrative  . Not on file   Social Determinants of Health   Financial Resource Strain:   . Difficulty of Paying Living Expenses: Not on file  Food Insecurity:   . Worried About Charity fundraiser in the Last Year: Not on file  . Ran Out of Food in the Last Year: Not on file  Transportation Needs:   . Not allowed  to drive   .   Physical Activity:   . Days of Exercise per Week: Not on file  . Minutes of Exercise per Session: Not on file  Stress:   . Feeling of Stress : Not on file  Social Connections:   . Frequency of Communication with Friends and Family: Not on file  . Frequency of Social Gatherings with Friends and Family: Not on file  . Attends Religious Services: Not on file  . Active Member of Clubs or Organizations: Not on file  . Attends Archivist Meetings: Not on file  . Marital Status: Not on file  Intimate Partner Violence:   . Fear of Current or Ex-Partner: Not on file  . Emotionally Abused: Not on file  . Physically Abused: Not on file  . Sexually Abused: Not on file    Family History  Problem Relation Age of Onset  . Deep vein thrombosis Mother   . Hypertension Mother   . Seizures Daughter     Past Medical History:  Diagnosis Date  . A-fib ([place])   . Cataracts, bilateral   . CHF (congestive heart failure) ([place])   . Hypertension   . Iron deficiency anemia     Past Surgical History:  Procedure Laterality Date  . APPENDECTOMY    . HERNIA REPAIR      Current Outpatient Medications  Medication Sig Dispense Refill  . atorvastatin (LIPITOR) 80 MG tablet Take 0.5 tablets (40 mg total) by mouth daily at 6 PM. 30 tablet 3  . FEROSUL 325 (65 Fe) MG tablet Take 325 mg by mouth daily.    . furosemide (LASIX) 40 MG tablet Take 40 mg by mouth daily.    Marland Kitchen levETIRAcetam (KEPPRA) 500 MG tablet Take 1 tablet (500 mg total) by mouth 2 (two) times daily. 60 tablet 3  . metoprolol succinate (TOPROL-XL) 25 MG 24 hr tablet Take 25 mg by mouth daily.    Alveda Reasons 20 MG TABS tablet Take 20 mg by mouth daily.     No current facility-administered medications for this visit.    Allergies as of 05/03/2019  . (No Known Allergies)    Vitals: BP 140/81   Pulse 77   Temp (!) 97.2 F (36.2 C)   Ht 5\' 4"  (1.626 m)   Wt 196 lb (88.9 kg)   BMI 33.64 kg/m  Last Weight:  Wt  Readings from Last 1 Encounters:  05/03/19 196 lb (88.9 kg)   Last Height:   Ht Readings from Last 1 Encounters:  05/03/19 5\' 4"  (1.626 m)    Physical exam:  General: The patient is awake, alert and appears not in acute distress. Head: Normocephalic, atraumatic. Neck is supple. Cardiovascular:  irregular rate and rhythm, without murmurs or carotid bruit, and  without distended neck veins. Respiratory: Lungs are clear to auscultation. Skin:  Severe bruising over chest and left arm- with a large hematoma at the left elbow.  Trunk: BMI is 33-elevated and patient  has normal posture.  Neurologic exam : The patient is awake and alert, oriented to place and time.   Memory subjective described as intact.  There is a normal attention span & concentration ability.  Speech is fluent but severely dysphonic-  no aphasia. Mood and affect are appropriate.  Cranial nerves: Pupils are equal in size, movements are conjugate. The cataracts make an retinal examination impossible.  Funduscopic exam deferred.  Extraocular movements in vertical and horizontal planes intact - these are spontaneous eye movements. Visual fields by finger perimetry deferred.  He noticed silhouettes and only in the lower visual field, more to the left.  Hearing to finger rub  impaired   Facial sensation intact to fine touch.  Facial motor strength is symmetric and tongue and uvula move midline. Tongue protrusion into either cheek is normal. Shoulder shrug is normal.  Motor exam: Normal tone , muscle bulk and symmetric strength in all extremities.  Sensory:  Fine touch, pinprick and vibration were tested in all extremities. Proprioception was normal.  Coordination: Rapid alternating movements in the fingers/hands were normal.   Gait and station: Patient walks without assistive device. Deep tendon reflexes: in the upper and lower extremities are symmetric and intact. Babinski maneuver response was deferred.    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   abnormal MRI and EEG warrant ongoing treatment for seizure prevention.   Plan:  Treatment plan and additional workup :  Keppra 500 mg bid -initiated 21 days ago- had only BMET , not CMET.   also needs referral for cataract surgery - urgent , follow with visual field test.  Rv in 5 month with NP.   Porfirio Mylar Kalev Temme MD 05/03/2019

## 2019-05-03 NOTE — Patient Instructions (Signed)
Levetiracetam tablets What is this medicine? LEVETIRACETAM (lee ve tye RA se tam) is an antiepileptic drug. It is used with other medicines to treat certain types of seizures. This medicine may be used for other purposes; ask your health care provider or pharmacist if you have questions. COMMON BRAND NAME(S): Keppra, Roweepra What should I tell my health care provider before I take this medicine? They need to know if you have any of these conditions:  kidney disease  suicidal thoughts, plans, or attempt; a previous suicide attempt by you or a family member  an unusual or allergic reaction to levetiracetam, other medicines, foods, dyes, or preservatives  pregnant or trying to get pregnant  breast-feeding How should I use this medicine? Take this medicine by mouth with a glass of water. Follow the directions on the prescription label. Swallow the tablets whole. Do not crush or chew this medicine. You may take this medicine with or without food. Take your doses at regular intervals. Do not take your medicine more often than directed. Do not stop taking this medicine or any of your seizure medicines unless instructed by your doctor or health care professional. Stopping your medicine suddenly can increase your seizures or their severity. A special MedGuide will be given to you by the pharmacist with each prescription and refill. Be sure to read this information carefully each time. Contact your pediatrician or health care professional regarding the use of this medication in children. While this drug may be prescribed for children as young as 4 years of age for selected conditions, precautions do apply. Overdosage: If you think you have taken too much of this medicine contact a poison control center or emergency room at once. NOTE: This medicine is only for you. Do not share this medicine with others. What if I miss a dose? If you miss a dose, take it as soon as you can. If it is almost time for  your next dose, take only that dose. Do not take double or extra doses. What may interact with this medicine? This medicine may interact with the following medications:  carbamazepine  colesevelam  probenecid  sevelamer This list may not describe all possible interactions. Give your health care provider a list of all the medicines, herbs, non-prescription drugs, or dietary supplements you use. Also tell them if you smoke, drink alcohol, or use illegal drugs. Some items may interact with your medicine. What should I watch for while using this medicine? Visit your doctor or health care provider for a regular check on your progress. Wear a medical identification bracelet or chain to say you have epilepsy, and carry a card that lists all your medications. This medicine may cause serious skin reactions. They can happen weeks to months after starting the medicine. Contact your health care provider right away if you notice fevers or flu-like symptoms with a rash. The rash may be red or purple and then turn into blisters or peeling of the skin. Or, you might notice a red rash with swelling of the face, lips or lymph nodes in your neck or under your arms. It is important to take this medicine exactly as instructed by your health care provider. When first starting treatment, your dose may need to be adjusted. It may take weeks or months before your dose is stable. You should contact your doctor or health care provider if your seizures get worse or if you have any new types of seizures. You may get drowsy or dizzy. Do not drive,   use machinery, or do anything that needs mental alertness until you know how this medicine affects you. Do not stand or sit up quickly, especially if you are an older patient. This reduces the risk of dizzy or fainting spells. Alcohol may interfere with the effect of this medicine. Avoid alcoholic drinks. The use of this medicine may increase the chance of suicidal thoughts or actions.  Pay special attention to how you are responding while on this medicine. Any worsening of mood, or thoughts of suicide or dying should be reported to your health care provider right away. Women who become pregnant while using this medicine may enroll in the [place] American Antiepileptic Drug Pregnancy Registry by calling 1-888-233-2334. This registry collects information about the safety of antiepileptic drug use during pregnancy. What side effects may I notice from receiving this medicine? Side effects that you should report to your doctor or health care professional as soon as possible:  allergic reactions like skin rash, itching or hives, swelling of the face, lips, or tongue  breathing problems  dark urine  general ill feeling or flu-like symptoms  problems with balance, talking, walking  rash, fever, and swollen lymph nodes  redness, blistering, peeling or loosening of the skin, including inside the mouth  unusually weak or tired  worsening of mood, thoughts or actions of suicide or dying  yellowing of the eyes or skin Side effects that usually do not require medical attention (report to your doctor or health care professional if they continue or are bothersome):  diarrhea  dizzy, drowsy  headache  loss of appetite This list may not describe all possible side effects. Call your doctor for medical advice about side effects. You may report side effects to FDA at 1-800-FDA-1088. Where should I keep my medicine? Keep out of reach of children. Store at room temperature between 15 and 30 degrees C (59 and 86 degrees F). Throw away any unused medicine after the expiration date. NOTE: This sheet is a summary. It may not cover all possible information. If you have questions about this medicine, talk to your doctor, pharmacist, or health care provider.  2020 Elsevier/Gold Standard (2018-06-16 15:23:36)  

## 2019-05-04 NOTE — Progress Notes (Signed)
Normal CMET, elevated alk phosphatase is to be followed by PCP- possible inflammatory marker.  Cc to PCP at [facility]

## 2019-05-05 LAB — COMPREHENSIVE METABOLIC PANEL
ALT: 11 IU/L (ref 0–44)
AST: 15 IU/L (ref 0–40)
Albumin/Globulin Ratio: 1.7 (ref 1.2–2.2)
Albumin: 4.4 g/dL (ref 3.7–4.7)
Alkaline Phosphatase: 119 IU/L — ABNORMAL HIGH (ref 39–117)
BUN/Creatinine Ratio: 14 (ref 10–24)
BUN: 15 mg/dL (ref 8–27)
Bilirubin Total: 0.4 mg/dL (ref 0.0–1.2)
CO2: 23 mmol/L (ref 20–29)
Calcium: 8.6 mg/dL (ref 8.6–10.2)
Chloride: 104 mmol/L (ref 96–106)
Creatinine, Ser: 1.09 mg/dL (ref 0.76–1.27)
GFR calc Af Amer: 74 mL/min/{1.73_m2} (ref >59)
GFR calc non Af Amer: 64 mL/min/{1.73_m2} (ref >59)
Globulin, Total: 2.6 g/dL (ref 1.5–4.5)
Glucose: 91 mg/dL (ref 65–99)
Potassium: 3.9 mmol/L (ref 3.5–5.2)
Sodium: 142 mmol/L (ref 134–144)
Total Protein: 7 g/dL (ref 6.0–8.5)

## 2019-05-05 LAB — LEVETIRACETAM LEVEL: Levetiracetam Lvl: 19.4 ug/mL (ref 10.0–40.0)

## 2019-05-07 ENCOUNTER — Telehealth: Payer: Self-pay | Admitting: Neurology

## 2019-05-07 NOTE — Telephone Encounter (Signed)
Called the patient to review lab results.  There was no answer, LVm asking for a return call.   **If patient calls back, please advise lab work showed that the keppra levels were in a good range. His other lab work was in normal range as well except Phosphatase level which was only slightly elevated. May not be anything of concern but should follow up with primary care to make sure no concerns. I can route to the pt PCP if he advises who that is.     "Normal CMET, elevated alk phosphatase is to be followed by PCP- possible inflammatory marker."

## 2019-05-07 NOTE — Telephone Encounter (Signed)
-----   Message from Melvyn Novas, MD sent at 05/07/2019  9:02 AM EST ----- Keppra level is normal-

## 2019-07-26 ENCOUNTER — Other Ambulatory Visit: Payer: Self-pay | Admitting: Internal Medicine

## 2019-08-04 ENCOUNTER — Other Ambulatory Visit: Payer: Self-pay | Admitting: Internal Medicine

## 2019-08-07 ENCOUNTER — Other Ambulatory Visit: Payer: Self-pay | Admitting: Internal Medicine

## 2019-10-31 ENCOUNTER — Ambulatory Visit: Payer: Medicare HMO | Admitting: Adult Health

## 2019-11-30 ENCOUNTER — Other Ambulatory Visit: Payer: Self-pay | Admitting: Internal Medicine

## 2019-12-28 ENCOUNTER — Other Ambulatory Visit: Payer: Self-pay | Admitting: Internal Medicine

## 2020-01-02 DIAGNOSIS — I4891 Unspecified atrial fibrillation: Secondary | ICD-10-CM | POA: Diagnosis not present

## 2020-01-02 DIAGNOSIS — I10 Essential (primary) hypertension: Secondary | ICD-10-CM | POA: Diagnosis not present

## 2020-01-02 DIAGNOSIS — E7800 Pure hypercholesterolemia, unspecified: Secondary | ICD-10-CM | POA: Diagnosis not present

## 2020-01-02 DIAGNOSIS — I509 Heart failure, unspecified: Secondary | ICD-10-CM | POA: Diagnosis not present

## 2020-01-02 DIAGNOSIS — Z23 Encounter for immunization: Secondary | ICD-10-CM | POA: Diagnosis not present

## 2020-01-02 DIAGNOSIS — D509 Iron deficiency anemia, unspecified: Secondary | ICD-10-CM | POA: Diagnosis not present

## 2020-01-02 DIAGNOSIS — G40909 Epilepsy, unspecified, not intractable, without status epilepticus: Secondary | ICD-10-CM | POA: Diagnosis not present

## 2020-01-02 DIAGNOSIS — Z139 Encounter for screening, unspecified: Secondary | ICD-10-CM | POA: Diagnosis not present

## 2020-01-02 DIAGNOSIS — Z9181 History of falling: Secondary | ICD-10-CM | POA: Diagnosis not present

## 2020-05-01 DIAGNOSIS — I10 Essential (primary) hypertension: Secondary | ICD-10-CM | POA: Diagnosis not present

## 2020-05-01 DIAGNOSIS — I5043 Acute on chronic combined systolic (congestive) and diastolic (congestive) heart failure: Secondary | ICD-10-CM | POA: Diagnosis not present

## 2020-05-01 DIAGNOSIS — I081 Rheumatic disorders of both mitral and tricuspid valves: Secondary | ICD-10-CM | POA: Diagnosis not present

## 2020-05-01 DIAGNOSIS — Z20822 Contact with and (suspected) exposure to covid-19: Secondary | ICD-10-CM | POA: Diagnosis not present

## 2020-05-01 DIAGNOSIS — B356 Tinea cruris: Secondary | ICD-10-CM | POA: Diagnosis not present

## 2020-05-01 DIAGNOSIS — R310 Gross hematuria: Secondary | ICD-10-CM | POA: Diagnosis not present

## 2020-05-01 DIAGNOSIS — D649 Anemia, unspecified: Secondary | ICD-10-CM | POA: Diagnosis not present

## 2020-05-01 DIAGNOSIS — I517 Cardiomegaly: Secondary | ICD-10-CM | POA: Diagnosis not present

## 2020-05-01 DIAGNOSIS — Z8679 Personal history of other diseases of the circulatory system: Secondary | ICD-10-CM | POA: Diagnosis not present

## 2020-05-01 DIAGNOSIS — G40509 Epileptic seizures related to external causes, not intractable, without status epilepticus: Secondary | ICD-10-CM | POA: Diagnosis not present

## 2020-05-01 DIAGNOSIS — R509 Fever, unspecified: Secondary | ICD-10-CM | POA: Diagnosis not present

## 2020-05-01 DIAGNOSIS — N390 Urinary tract infection, site not specified: Secondary | ICD-10-CM | POA: Diagnosis not present

## 2020-05-01 DIAGNOSIS — I4891 Unspecified atrial fibrillation: Secondary | ICD-10-CM | POA: Diagnosis not present

## 2020-05-01 DIAGNOSIS — R6521 Severe sepsis with septic shock: Secondary | ICD-10-CM | POA: Diagnosis not present

## 2020-05-01 DIAGNOSIS — R531 Weakness: Secondary | ICD-10-CM | POA: Diagnosis not present

## 2020-05-01 DIAGNOSIS — K7689 Other specified diseases of liver: Secondary | ICD-10-CM | POA: Diagnosis not present

## 2020-05-01 DIAGNOSIS — S7002XA Contusion of left hip, initial encounter: Secondary | ICD-10-CM | POA: Diagnosis not present

## 2020-05-01 DIAGNOSIS — I959 Hypotension, unspecified: Secondary | ICD-10-CM | POA: Diagnosis not present

## 2020-05-01 DIAGNOSIS — R5381 Other malaise: Secondary | ICD-10-CM | POA: Diagnosis not present

## 2020-05-01 DIAGNOSIS — I083 Combined rheumatic disorders of mitral, aortic and tricuspid valves: Secondary | ICD-10-CM | POA: Diagnosis not present

## 2020-05-01 DIAGNOSIS — R600 Localized edema: Secondary | ICD-10-CM | POA: Diagnosis not present

## 2020-05-01 DIAGNOSIS — R945 Abnormal results of liver function studies: Secondary | ICD-10-CM | POA: Diagnosis not present

## 2020-05-01 DIAGNOSIS — G9340 Encephalopathy, unspecified: Secondary | ICD-10-CM | POA: Diagnosis not present

## 2020-05-01 DIAGNOSIS — R319 Hematuria, unspecified: Secondary | ICD-10-CM | POA: Diagnosis not present

## 2020-05-01 DIAGNOSIS — I639 Cerebral infarction, unspecified: Secondary | ICD-10-CM | POA: Diagnosis not present

## 2020-05-01 DIAGNOSIS — A419 Sepsis, unspecified organism: Secondary | ICD-10-CM | POA: Diagnosis not present

## 2020-05-01 DIAGNOSIS — I110 Hypertensive heart disease with heart failure: Secondary | ICD-10-CM | POA: Diagnosis not present

## 2020-05-01 DIAGNOSIS — I482 Chronic atrial fibrillation, unspecified: Secondary | ICD-10-CM | POA: Diagnosis not present

## 2020-05-01 DIAGNOSIS — G40909 Epilepsy, unspecified, not intractable, without status epilepticus: Secondary | ICD-10-CM | POA: Diagnosis not present

## 2020-05-01 DIAGNOSIS — N289 Disorder of kidney and ureter, unspecified: Secondary | ICD-10-CM | POA: Diagnosis not present

## 2020-05-01 DIAGNOSIS — J189 Pneumonia, unspecified organism: Secondary | ICD-10-CM | POA: Diagnosis not present

## 2020-05-01 DIAGNOSIS — Z723 Lack of physical exercise: Secondary | ICD-10-CM | POA: Diagnosis not present

## 2020-05-01 DIAGNOSIS — S3993XA Unspecified injury of pelvis, initial encounter: Secondary | ICD-10-CM | POA: Diagnosis not present

## 2020-05-01 DIAGNOSIS — N281 Cyst of kidney, acquired: Secondary | ICD-10-CM | POA: Diagnosis not present

## 2020-05-01 DIAGNOSIS — R160 Hepatomegaly, not elsewhere classified: Secondary | ICD-10-CM | POA: Diagnosis not present

## 2020-05-02 DIAGNOSIS — N289 Disorder of kidney and ureter, unspecified: Secondary | ICD-10-CM | POA: Diagnosis not present

## 2020-05-02 DIAGNOSIS — R509 Fever, unspecified: Secondary | ICD-10-CM | POA: Diagnosis not present

## 2020-05-02 DIAGNOSIS — Z8679 Personal history of other diseases of the circulatory system: Secondary | ICD-10-CM | POA: Diagnosis not present

## 2020-05-02 DIAGNOSIS — R531 Weakness: Secondary | ICD-10-CM | POA: Diagnosis not present

## 2020-05-02 DIAGNOSIS — I959 Hypotension, unspecified: Secondary | ICD-10-CM | POA: Diagnosis not present

## 2020-05-02 DIAGNOSIS — N281 Cyst of kidney, acquired: Secondary | ICD-10-CM | POA: Diagnosis not present

## 2020-05-02 DIAGNOSIS — J189 Pneumonia, unspecified organism: Secondary | ICD-10-CM | POA: Diagnosis not present

## 2020-05-02 DIAGNOSIS — I5043 Acute on chronic combined systolic (congestive) and diastolic (congestive) heart failure: Secondary | ICD-10-CM | POA: Diagnosis not present

## 2020-05-02 DIAGNOSIS — R310 Gross hematuria: Secondary | ICD-10-CM | POA: Diagnosis not present

## 2020-05-02 DIAGNOSIS — K7689 Other specified diseases of liver: Secondary | ICD-10-CM | POA: Diagnosis not present

## 2020-05-02 DIAGNOSIS — R160 Hepatomegaly, not elsewhere classified: Secondary | ICD-10-CM | POA: Diagnosis not present

## 2020-05-02 DIAGNOSIS — D649 Anemia, unspecified: Secondary | ICD-10-CM | POA: Diagnosis not present

## 2020-05-02 DIAGNOSIS — I517 Cardiomegaly: Secondary | ICD-10-CM | POA: Diagnosis not present

## 2020-05-02 DIAGNOSIS — R319 Hematuria, unspecified: Secondary | ICD-10-CM | POA: Diagnosis not present

## 2020-05-02 DIAGNOSIS — I110 Hypertensive heart disease with heart failure: Secondary | ICD-10-CM | POA: Diagnosis not present

## 2020-05-02 DIAGNOSIS — I4891 Unspecified atrial fibrillation: Secondary | ICD-10-CM | POA: Diagnosis not present

## 2020-05-02 DIAGNOSIS — G40909 Epilepsy, unspecified, not intractable, without status epilepticus: Secondary | ICD-10-CM | POA: Diagnosis not present

## 2020-05-03 DIAGNOSIS — R160 Hepatomegaly, not elsewhere classified: Secondary | ICD-10-CM | POA: Diagnosis not present

## 2020-05-03 DIAGNOSIS — I959 Hypotension, unspecified: Secondary | ICD-10-CM | POA: Diagnosis not present

## 2020-05-03 DIAGNOSIS — G40909 Epilepsy, unspecified, not intractable, without status epilepticus: Secondary | ICD-10-CM | POA: Diagnosis not present

## 2020-05-03 DIAGNOSIS — I5043 Acute on chronic combined systolic (congestive) and diastolic (congestive) heart failure: Secondary | ICD-10-CM | POA: Diagnosis not present

## 2020-05-03 DIAGNOSIS — N289 Disorder of kidney and ureter, unspecified: Secondary | ICD-10-CM | POA: Diagnosis not present

## 2020-05-03 DIAGNOSIS — D649 Anemia, unspecified: Secondary | ICD-10-CM | POA: Diagnosis not present

## 2020-05-03 DIAGNOSIS — I110 Hypertensive heart disease with heart failure: Secondary | ICD-10-CM | POA: Diagnosis not present

## 2020-05-03 DIAGNOSIS — R319 Hematuria, unspecified: Secondary | ICD-10-CM | POA: Diagnosis not present

## 2020-05-03 DIAGNOSIS — I4891 Unspecified atrial fibrillation: Secondary | ICD-10-CM | POA: Diagnosis not present

## 2020-05-04 DIAGNOSIS — N390 Urinary tract infection, site not specified: Secondary | ICD-10-CM | POA: Diagnosis not present

## 2020-05-04 DIAGNOSIS — I5043 Acute on chronic combined systolic (congestive) and diastolic (congestive) heart failure: Secondary | ICD-10-CM | POA: Diagnosis not present

## 2020-05-04 DIAGNOSIS — R319 Hematuria, unspecified: Secondary | ICD-10-CM | POA: Diagnosis not present

## 2020-05-04 DIAGNOSIS — A419 Sepsis, unspecified organism: Secondary | ICD-10-CM | POA: Diagnosis not present

## 2020-05-04 DIAGNOSIS — R6521 Severe sepsis with septic shock: Secondary | ICD-10-CM | POA: Diagnosis not present

## 2020-05-04 DIAGNOSIS — N289 Disorder of kidney and ureter, unspecified: Secondary | ICD-10-CM | POA: Diagnosis not present

## 2020-05-04 DIAGNOSIS — D649 Anemia, unspecified: Secondary | ICD-10-CM | POA: Diagnosis not present

## 2020-05-04 DIAGNOSIS — I110 Hypertensive heart disease with heart failure: Secondary | ICD-10-CM | POA: Diagnosis not present

## 2020-05-04 DIAGNOSIS — I4891 Unspecified atrial fibrillation: Secondary | ICD-10-CM | POA: Diagnosis not present

## 2020-05-05 DIAGNOSIS — A419 Sepsis, unspecified organism: Secondary | ICD-10-CM | POA: Diagnosis not present

## 2020-05-05 DIAGNOSIS — I083 Combined rheumatic disorders of mitral, aortic and tricuspid valves: Secondary | ICD-10-CM | POA: Diagnosis not present

## 2020-05-05 DIAGNOSIS — I4891 Unspecified atrial fibrillation: Secondary | ICD-10-CM | POA: Diagnosis not present

## 2020-05-05 DIAGNOSIS — N289 Disorder of kidney and ureter, unspecified: Secondary | ICD-10-CM | POA: Diagnosis not present

## 2020-05-05 DIAGNOSIS — R6521 Severe sepsis with septic shock: Secondary | ICD-10-CM | POA: Diagnosis not present

## 2020-05-05 DIAGNOSIS — D649 Anemia, unspecified: Secondary | ICD-10-CM | POA: Diagnosis not present

## 2020-05-05 DIAGNOSIS — R160 Hepatomegaly, not elsewhere classified: Secondary | ICD-10-CM | POA: Diagnosis not present

## 2020-05-05 DIAGNOSIS — R310 Gross hematuria: Secondary | ICD-10-CM | POA: Diagnosis not present

## 2020-05-05 DIAGNOSIS — I5043 Acute on chronic combined systolic (congestive) and diastolic (congestive) heart failure: Secondary | ICD-10-CM | POA: Diagnosis not present

## 2020-05-05 DIAGNOSIS — N390 Urinary tract infection, site not specified: Secondary | ICD-10-CM | POA: Diagnosis not present

## 2020-05-06 DIAGNOSIS — A419 Sepsis, unspecified organism: Secondary | ICD-10-CM | POA: Diagnosis not present

## 2020-05-06 DIAGNOSIS — I5043 Acute on chronic combined systolic (congestive) and diastolic (congestive) heart failure: Secondary | ICD-10-CM | POA: Diagnosis not present

## 2020-05-06 DIAGNOSIS — I482 Chronic atrial fibrillation, unspecified: Secondary | ICD-10-CM | POA: Diagnosis not present

## 2020-05-06 DIAGNOSIS — G40509 Epileptic seizures related to external causes, not intractable, without status epilepticus: Secondary | ICD-10-CM | POA: Diagnosis not present

## 2020-05-06 DIAGNOSIS — R5381 Other malaise: Secondary | ICD-10-CM | POA: Diagnosis not present

## 2020-05-06 DIAGNOSIS — R600 Localized edema: Secondary | ICD-10-CM | POA: Diagnosis not present

## 2020-05-06 DIAGNOSIS — N390 Urinary tract infection, site not specified: Secondary | ICD-10-CM | POA: Diagnosis not present

## 2020-05-06 DIAGNOSIS — G40909 Epilepsy, unspecified, not intractable, without status epilepticus: Secondary | ICD-10-CM | POA: Diagnosis not present

## 2020-05-06 DIAGNOSIS — I110 Hypertensive heart disease with heart failure: Secondary | ICD-10-CM | POA: Diagnosis not present

## 2020-05-06 DIAGNOSIS — I10 Essential (primary) hypertension: Secondary | ICD-10-CM | POA: Diagnosis not present

## 2020-05-06 DIAGNOSIS — Z723 Lack of physical exercise: Secondary | ICD-10-CM | POA: Diagnosis not present

## 2020-05-06 DIAGNOSIS — D649 Anemia, unspecified: Secondary | ICD-10-CM | POA: Diagnosis not present

## 2020-05-06 DIAGNOSIS — U071 COVID-19: Secondary | ICD-10-CM | POA: Diagnosis not present

## 2020-05-06 DIAGNOSIS — R6521 Severe sepsis with septic shock: Secondary | ICD-10-CM | POA: Diagnosis not present

## 2020-05-08 DIAGNOSIS — G40509 Epileptic seizures related to external causes, not intractable, without status epilepticus: Secondary | ICD-10-CM | POA: Diagnosis not present

## 2020-05-08 DIAGNOSIS — A419 Sepsis, unspecified organism: Secondary | ICD-10-CM | POA: Diagnosis not present

## 2020-05-08 DIAGNOSIS — R6521 Severe sepsis with septic shock: Secondary | ICD-10-CM | POA: Diagnosis not present

## 2020-05-08 DIAGNOSIS — I5043 Acute on chronic combined systolic (congestive) and diastolic (congestive) heart failure: Secondary | ICD-10-CM | POA: Diagnosis not present

## 2020-05-15 DIAGNOSIS — U071 COVID-19: Secondary | ICD-10-CM | POA: Diagnosis not present

## 2020-05-23 DIAGNOSIS — U071 COVID-19: Secondary | ICD-10-CM | POA: Diagnosis not present

## 2020-05-23 DIAGNOSIS — A419 Sepsis, unspecified organism: Secondary | ICD-10-CM | POA: Diagnosis not present

## 2020-05-23 DIAGNOSIS — G40509 Epileptic seizures related to external causes, not intractable, without status epilepticus: Secondary | ICD-10-CM | POA: Diagnosis not present

## 2020-05-23 DIAGNOSIS — I5043 Acute on chronic combined systolic (congestive) and diastolic (congestive) heart failure: Secondary | ICD-10-CM | POA: Diagnosis not present

## 2020-05-23 DIAGNOSIS — R6521 Severe sepsis with septic shock: Secondary | ICD-10-CM | POA: Diagnosis not present

## 2020-05-26 ENCOUNTER — Other Ambulatory Visit: Payer: Self-pay

## 2020-05-26 NOTE — Patient Outreach (Signed)
[facility]) Care Management  05/26/2020  Raziel Koenigs [birth year] [id number]   Referral Date: 05/26/20 Referral Source: [employer] Report Referral Reason: Discharge [facility] 05/23/20   Outreach Attempt: No answer. HIPAA compliant voice message left.   Plan: RN CM will attempt again within 4 business days and send letter.   [name], RN, MSN [facility] Care Management Care Management Coordinator Direct Line [phone] Toll Free: [phone]  Fax: [phone]

## 2020-05-27 ENCOUNTER — Other Ambulatory Visit: Payer: Self-pay

## 2020-05-27 NOTE — Patient Outreach (Signed)
[facility]) Care Management  05/27/2020  Brandon Mata [birth year] [id number]   Referral Date: 05/26/20 Referral Source: [employer] Report Referral Reason: Discharge [facility] 05/23/20   Outreach Attempt: No answer. HIPAA compliant voice message left.   Plan: RN CM will attempt again within 4 business days.  [name], RN, MSN [facility] Care Management Care Management Coordinator Direct Line [phone] Cell [phone] Toll Free: [phone]  Fax: [phone]

## 2020-05-28 ENCOUNTER — Other Ambulatory Visit: Payer: Self-pay

## 2020-05-28 NOTE — Patient Outreach (Signed)
[facility]) Care Management  05/28/2020  Brandon Mata 1940-04-17 [id number]   Referral Date:05/26/20 Referral Source:[employer] Report Referral Reason:Discharge [facility] 05/23/20   Outreach Attempt:No answer. HIPAA compliant voice message left.   Plan:RN CM will attempt again in the month of March.  [name], RN, MSN [facility] Care Management Care Management Coordinator Direct Line [phone] Cell [phone] Toll Free: [phone]  Fax: [phone]

## 2020-06-02 DIAGNOSIS — E46 Unspecified protein-calorie malnutrition: Secondary | ICD-10-CM | POA: Diagnosis not present

## 2020-06-02 DIAGNOSIS — N289 Disorder of kidney and ureter, unspecified: Secondary | ICD-10-CM | POA: Diagnosis not present

## 2020-06-02 DIAGNOSIS — I482 Chronic atrial fibrillation, unspecified: Secondary | ICD-10-CM | POA: Diagnosis not present

## 2020-06-02 DIAGNOSIS — I110 Hypertensive heart disease with heart failure: Secondary | ICD-10-CM | POA: Diagnosis not present

## 2020-06-02 DIAGNOSIS — I5043 Acute on chronic combined systolic (congestive) and diastolic (congestive) heart failure: Secondary | ICD-10-CM | POA: Diagnosis not present

## 2020-06-02 DIAGNOSIS — G40909 Epilepsy, unspecified, not intractable, without status epilepticus: Secondary | ICD-10-CM | POA: Diagnosis not present

## 2020-06-02 DIAGNOSIS — I4891 Unspecified atrial fibrillation: Secondary | ICD-10-CM | POA: Diagnosis not present

## 2020-06-02 DIAGNOSIS — L89622 Pressure ulcer of left heel, stage 2: Secondary | ICD-10-CM | POA: Diagnosis not present

## 2020-06-02 DIAGNOSIS — R638 Other symptoms and signs concerning food and fluid intake: Secondary | ICD-10-CM | POA: Diagnosis not present

## 2020-06-02 DIAGNOSIS — R531 Weakness: Secondary | ICD-10-CM | POA: Diagnosis not present

## 2020-06-02 DIAGNOSIS — H269 Unspecified cataract: Secondary | ICD-10-CM | POA: Diagnosis not present

## 2020-06-02 DIAGNOSIS — K7689 Other specified diseases of liver: Secondary | ICD-10-CM | POA: Diagnosis not present

## 2020-06-02 DIAGNOSIS — U071 COVID-19: Secondary | ICD-10-CM | POA: Diagnosis not present

## 2020-06-03 DIAGNOSIS — H269 Unspecified cataract: Secondary | ICD-10-CM | POA: Diagnosis not present

## 2020-06-03 DIAGNOSIS — I5043 Acute on chronic combined systolic (congestive) and diastolic (congestive) heart failure: Secondary | ICD-10-CM | POA: Diagnosis not present

## 2020-06-03 DIAGNOSIS — N289 Disorder of kidney and ureter, unspecified: Secondary | ICD-10-CM | POA: Diagnosis not present

## 2020-06-03 DIAGNOSIS — I5042 Chronic combined systolic (congestive) and diastolic (congestive) heart failure: Secondary | ICD-10-CM | POA: Diagnosis not present

## 2020-06-03 DIAGNOSIS — R531 Weakness: Secondary | ICD-10-CM | POA: Diagnosis not present

## 2020-06-03 DIAGNOSIS — I110 Hypertensive heart disease with heart failure: Secondary | ICD-10-CM | POA: Diagnosis not present

## 2020-06-03 DIAGNOSIS — I4891 Unspecified atrial fibrillation: Secondary | ICD-10-CM | POA: Diagnosis not present

## 2020-06-03 DIAGNOSIS — I482 Chronic atrial fibrillation, unspecified: Secondary | ICD-10-CM | POA: Diagnosis not present

## 2020-06-03 DIAGNOSIS — L89622 Pressure ulcer of left heel, stage 2: Secondary | ICD-10-CM | POA: Diagnosis not present

## 2020-06-03 DIAGNOSIS — E46 Unspecified protein-calorie malnutrition: Secondary | ICD-10-CM | POA: Diagnosis not present

## 2020-06-03 DIAGNOSIS — U071 COVID-19: Secondary | ICD-10-CM | POA: Diagnosis not present

## 2020-06-03 DIAGNOSIS — G40909 Epilepsy, unspecified, not intractable, without status epilepticus: Secondary | ICD-10-CM | POA: Diagnosis not present

## 2020-06-03 DIAGNOSIS — K769 Liver disease, unspecified: Secondary | ICD-10-CM | POA: Diagnosis not present

## 2020-06-03 DIAGNOSIS — R32 Unspecified urinary incontinence: Secondary | ICD-10-CM | POA: Diagnosis not present

## 2020-06-04 DIAGNOSIS — R531 Weakness: Secondary | ICD-10-CM | POA: Diagnosis not present

## 2020-06-04 DIAGNOSIS — I110 Hypertensive heart disease with heart failure: Secondary | ICD-10-CM | POA: Diagnosis not present

## 2020-06-04 DIAGNOSIS — U071 COVID-19: Secondary | ICD-10-CM | POA: Diagnosis not present

## 2020-06-04 DIAGNOSIS — R160 Hepatomegaly, not elsewhere classified: Secondary | ICD-10-CM | POA: Diagnosis not present

## 2020-06-04 DIAGNOSIS — G40909 Epilepsy, unspecified, not intractable, without status epilepticus: Secondary | ICD-10-CM | POA: Diagnosis not present

## 2020-06-04 DIAGNOSIS — N289 Disorder of kidney and ureter, unspecified: Secondary | ICD-10-CM | POA: Diagnosis not present

## 2020-06-04 DIAGNOSIS — Z8673 Personal history of transient ischemic attack (TIA), and cerebral infarction without residual deficits: Secondary | ICD-10-CM | POA: Diagnosis not present

## 2020-06-04 DIAGNOSIS — I482 Chronic atrial fibrillation, unspecified: Secondary | ICD-10-CM | POA: Diagnosis not present

## 2020-06-04 DIAGNOSIS — I5042 Chronic combined systolic (congestive) and diastolic (congestive) heart failure: Secondary | ICD-10-CM | POA: Diagnosis not present

## 2020-06-04 DIAGNOSIS — I5043 Acute on chronic combined systolic (congestive) and diastolic (congestive) heart failure: Secondary | ICD-10-CM | POA: Diagnosis not present

## 2020-06-04 DIAGNOSIS — L89622 Pressure ulcer of left heel, stage 2: Secondary | ICD-10-CM | POA: Diagnosis not present

## 2020-06-04 DIAGNOSIS — I4891 Unspecified atrial fibrillation: Secondary | ICD-10-CM | POA: Diagnosis not present

## 2020-06-04 DIAGNOSIS — H26133 Total traumatic cataract, bilateral: Secondary | ICD-10-CM | POA: Diagnosis not present

## 2020-06-04 DIAGNOSIS — E46 Unspecified protein-calorie malnutrition: Secondary | ICD-10-CM | POA: Diagnosis not present

## 2020-06-04 DIAGNOSIS — D649 Anemia, unspecified: Secondary | ICD-10-CM | POA: Diagnosis not present

## 2020-06-04 DIAGNOSIS — H269 Unspecified cataract: Secondary | ICD-10-CM | POA: Diagnosis not present

## 2020-06-16 ENCOUNTER — Other Ambulatory Visit: Payer: Self-pay

## 2020-06-16 NOTE — Patient Outreach (Signed)
[facility]) Care Management  06/16/2020  Brandon Mata 06/22/1940 [id number]   Referral Date:05/26/20 Referral Source:[employer] Report Referral Reason:Discharge [facility] 05/23/20   Outreach Attempt:No answer. HIPAA compliant voice message left.   Plan:RN CM will close case.  [name], RN, MSN [facility] Care Management Care Management Coordinator Direct Line [phone] Cell [phone] Toll Free: [phone]  Fax: [phone]

## 2020-07-02 DIAGNOSIS — I10 Essential (primary) hypertension: Secondary | ICD-10-CM | POA: Diagnosis not present

## 2020-07-02 DIAGNOSIS — R160 Hepatomegaly, not elsewhere classified: Secondary | ICD-10-CM | POA: Diagnosis not present

## 2020-07-02 DIAGNOSIS — I509 Heart failure, unspecified: Secondary | ICD-10-CM | POA: Diagnosis not present

## 2020-07-02 DIAGNOSIS — E7800 Pure hypercholesterolemia, unspecified: Secondary | ICD-10-CM | POA: Diagnosis not present

## 2020-07-02 DIAGNOSIS — N2889 Other specified disorders of kidney and ureter: Secondary | ICD-10-CM | POA: Diagnosis not present

## 2020-07-02 DIAGNOSIS — A419 Sepsis, unspecified organism: Secondary | ICD-10-CM | POA: Diagnosis not present

## 2020-07-02 DIAGNOSIS — N390 Urinary tract infection, site not specified: Secondary | ICD-10-CM | POA: Diagnosis not present

## 2020-07-02 DIAGNOSIS — I4891 Unspecified atrial fibrillation: Secondary | ICD-10-CM | POA: Diagnosis not present

## 2020-07-02 DIAGNOSIS — G40909 Epilepsy, unspecified, not intractable, without status epilepticus: Secondary | ICD-10-CM | POA: Diagnosis not present

## 2020-07-02 DIAGNOSIS — D509 Iron deficiency anemia, unspecified: Secondary | ICD-10-CM | POA: Diagnosis not present

## 2020-07-04 ENCOUNTER — Other Ambulatory Visit: Payer: Self-pay

## 2020-07-04 NOTE — Patient Outreach (Signed)
[facility]) Care Management  07/04/2020  Dennard Vezina Jul 30, 1940 [id number]   Referral Date: 07/04/20 Referral Source: PING Referral Reason: D/C [facility] 06/27/20   Outreach Attempt: No answer.  HIPAA compliant voice message left.    Plan: RN CM will attempt patient again within 4 business days and send letter.   [name], RN, MSN [facility] Care Management Care Management Coordinator Direct Line [phone] Toll Free: [phone]  Fax: [phone]

## 2020-07-07 ENCOUNTER — Other Ambulatory Visit: Payer: Self-pay

## 2020-07-07 NOTE — Patient Outreach (Signed)
[facility]) Care Management  07/07/2020  Brandon Mata 1941-03-23 [id number]   Referral Date: 07/04/20 Referral Source: PING Referral Reason: D/C [facility] 06/27/20   Outreach Attempt: No answer.  HIPAA compliant voice message left.    Plan: RN CM will attempt patient again within 4 business days.   [name], RN, MSN [facility] Care Management Care Management Coordinator Direct Line [phone] Cell [phone] Toll Free: [phone]  Fax: [phone]

## 2020-07-08 ENCOUNTER — Other Ambulatory Visit: Payer: Self-pay

## 2020-07-08 NOTE — Patient Outreach (Signed)
[facility]) Care Management  07/08/2020  Brandon Mata 1940-10-02 [id number]   Referral Date:07/04/20 Referral Source:PING Referral Reason:D/C [facility] 06/27/20   Outreach Attempt:No answer. HIPAA compliant voice message left.   Plan:RN CM will attempt patient again in the next 3 weeks.   [name], RN, MSN [facility] Care Management Care Management Coordinator Direct Line [phone] Cell [phone] Toll Free: [phone]  Fax: [phone]

## 2020-07-25 ENCOUNTER — Other Ambulatory Visit: Payer: Self-pay

## 2020-07-25 NOTE — Patient Outreach (Signed)
[facility]) Care Management  07/25/2020  Brandon Mata [birth year] [id number]   Referral Date:07/04/20 Referral Source:PING Referral Reason:D/C [facility] 06/27/20   Outreach Attempt:No answer. HIPAA compliant voice message left.   Plan:RN CM will close case.  [name], RN, MSN [facility] Care Management Care Management Coordinator Direct Line [phone] Cell [phone] Toll Free: [phone]  Fax: [phone]

## 2020-10-13 DIAGNOSIS — E7800 Pure hypercholesterolemia, unspecified: Secondary | ICD-10-CM | POA: Diagnosis not present

## 2020-10-13 DIAGNOSIS — I10 Essential (primary) hypertension: Secondary | ICD-10-CM | POA: Diagnosis not present

## 2020-10-13 DIAGNOSIS — R634 Abnormal weight loss: Secondary | ICD-10-CM | POA: Diagnosis not present

## 2020-10-13 DIAGNOSIS — N2889 Other specified disorders of kidney and ureter: Secondary | ICD-10-CM | POA: Diagnosis not present

## 2020-10-13 DIAGNOSIS — Z1331 Encounter for screening for depression: Secondary | ICD-10-CM | POA: Diagnosis not present

## 2020-10-13 DIAGNOSIS — G40909 Epilepsy, unspecified, not intractable, without status epilepticus: Secondary | ICD-10-CM | POA: Diagnosis not present

## 2020-10-13 DIAGNOSIS — I509 Heart failure, unspecified: Secondary | ICD-10-CM | POA: Diagnosis not present

## 2020-10-13 DIAGNOSIS — I4891 Unspecified atrial fibrillation: Secondary | ICD-10-CM | POA: Diagnosis not present

## 2020-10-13 DIAGNOSIS — R160 Hepatomegaly, not elsewhere classified: Secondary | ICD-10-CM | POA: Diagnosis not present

## 2020-10-13 DIAGNOSIS — D509 Iron deficiency anemia, unspecified: Secondary | ICD-10-CM | POA: Diagnosis not present

## 2020-11-16 DIAGNOSIS — K828 Other specified diseases of gallbladder: Secondary | ICD-10-CM | POA: Diagnosis not present

## 2020-11-16 DIAGNOSIS — K7689 Other specified diseases of liver: Secondary | ICD-10-CM | POA: Diagnosis not present

## 2020-11-16 DIAGNOSIS — Z20822 Contact with and (suspected) exposure to covid-19: Secondary | ICD-10-CM | POA: Diagnosis not present

## 2020-11-16 DIAGNOSIS — N179 Acute kidney failure, unspecified: Secondary | ICD-10-CM | POA: Diagnosis not present

## 2020-11-16 DIAGNOSIS — I110 Hypertensive heart disease with heart failure: Secondary | ICD-10-CM | POA: Diagnosis not present

## 2020-11-16 DIAGNOSIS — R079 Chest pain, unspecified: Secondary | ICD-10-CM | POA: Diagnosis not present

## 2020-11-16 DIAGNOSIS — E860 Dehydration: Secondary | ICD-10-CM | POA: Diagnosis not present

## 2020-11-16 DIAGNOSIS — I959 Hypotension, unspecified: Secondary | ICD-10-CM | POA: Diagnosis not present

## 2020-11-16 DIAGNOSIS — S22009A Unspecified fracture of unspecified thoracic vertebra, initial encounter for closed fracture: Secondary | ICD-10-CM | POA: Diagnosis not present

## 2020-11-16 DIAGNOSIS — I4891 Unspecified atrial fibrillation: Secondary | ICD-10-CM | POA: Diagnosis not present

## 2020-11-16 DIAGNOSIS — S270XXA Traumatic pneumothorax, initial encounter: Secondary | ICD-10-CM | POA: Diagnosis not present

## 2020-11-16 DIAGNOSIS — S199XXA Unspecified injury of neck, initial encounter: Secondary | ICD-10-CM | POA: Diagnosis not present

## 2020-11-16 DIAGNOSIS — I509 Heart failure, unspecified: Secondary | ICD-10-CM | POA: Diagnosis not present

## 2020-11-16 DIAGNOSIS — R4182 Altered mental status, unspecified: Secondary | ICD-10-CM | POA: Diagnosis not present

## 2020-11-16 DIAGNOSIS — S3992XA Unspecified injury of lower back, initial encounter: Secondary | ICD-10-CM | POA: Diagnosis not present

## 2020-11-16 DIAGNOSIS — E876 Hypokalemia: Secondary | ICD-10-CM | POA: Diagnosis not present

## 2020-11-16 DIAGNOSIS — I4510 Unspecified right bundle-branch block: Secondary | ICD-10-CM | POA: Diagnosis not present

## 2020-11-16 DIAGNOSIS — G40909 Epilepsy, unspecified, not intractable, without status epilepticus: Secondary | ICD-10-CM | POA: Diagnosis not present

## 2020-11-16 DIAGNOSIS — I700 Atherosclerosis of aorta: Secondary | ICD-10-CM | POA: Diagnosis not present

## 2020-11-16 DIAGNOSIS — S2242XA Multiple fractures of ribs, left side, initial encounter for closed fracture: Secondary | ICD-10-CM | POA: Diagnosis not present

## 2020-11-16 DIAGNOSIS — J939 Pneumothorax, unspecified: Secondary | ICD-10-CM | POA: Diagnosis not present

## 2020-11-16 DIAGNOSIS — R778 Other specified abnormalities of plasma proteins: Secondary | ICD-10-CM | POA: Diagnosis not present

## 2020-11-16 DIAGNOSIS — R937 Abnormal findings on diagnostic imaging of other parts of musculoskeletal system: Secondary | ICD-10-CM | POA: Diagnosis not present

## 2020-11-17 DIAGNOSIS — R6521 Severe sepsis with septic shock: Secondary | ICD-10-CM | POA: Diagnosis not present

## 2020-11-17 DIAGNOSIS — R279 Unspecified lack of coordination: Secondary | ICD-10-CM | POA: Diagnosis not present

## 2020-11-17 DIAGNOSIS — I482 Chronic atrial fibrillation, unspecified: Secondary | ICD-10-CM | POA: Diagnosis not present

## 2020-11-17 DIAGNOSIS — D6489 Other specified anemias: Secondary | ICD-10-CM | POA: Diagnosis not present

## 2020-11-17 DIAGNOSIS — I10 Essential (primary) hypertension: Secondary | ICD-10-CM | POA: Diagnosis not present

## 2020-11-17 DIAGNOSIS — I4891 Unspecified atrial fibrillation: Secondary | ICD-10-CM | POA: Diagnosis not present

## 2020-11-17 DIAGNOSIS — S42031A Displaced fracture of lateral end of right clavicle, initial encounter for closed fracture: Secondary | ICD-10-CM | POA: Diagnosis not present

## 2020-11-17 DIAGNOSIS — S42034A Nondisplaced fracture of lateral end of right clavicle, initial encounter for closed fracture: Secondary | ICD-10-CM | POA: Diagnosis not present

## 2020-11-17 DIAGNOSIS — R296 Repeated falls: Secondary | ICD-10-CM | POA: Diagnosis not present

## 2020-11-17 DIAGNOSIS — I4510 Unspecified right bundle-branch block: Secondary | ICD-10-CM | POA: Diagnosis not present

## 2020-11-17 DIAGNOSIS — G9341 Metabolic encephalopathy: Secondary | ICD-10-CM | POA: Diagnosis not present

## 2020-11-17 DIAGNOSIS — R778 Other specified abnormalities of plasma proteins: Secondary | ICD-10-CM | POA: Diagnosis not present

## 2020-11-17 DIAGNOSIS — G40509 Epileptic seizures related to external causes, not intractable, without status epilepticus: Secondary | ICD-10-CM | POA: Diagnosis not present

## 2020-11-17 DIAGNOSIS — R5381 Other malaise: Secondary | ICD-10-CM | POA: Diagnosis not present

## 2020-11-17 DIAGNOSIS — K828 Other specified diseases of gallbladder: Secondary | ICD-10-CM | POA: Diagnosis not present

## 2020-11-17 DIAGNOSIS — Z452 Encounter for adjustment and management of vascular access device: Secondary | ICD-10-CM | POA: Diagnosis not present

## 2020-11-17 DIAGNOSIS — R4182 Altered mental status, unspecified: Secondary | ICD-10-CM | POA: Diagnosis not present

## 2020-11-17 DIAGNOSIS — R9401 Abnormal electroencephalogram [EEG]: Secondary | ICD-10-CM | POA: Diagnosis not present

## 2020-11-17 DIAGNOSIS — S43004D Unspecified dislocation of right shoulder joint, subsequent encounter: Secondary | ICD-10-CM | POA: Diagnosis not present

## 2020-11-17 DIAGNOSIS — E876 Hypokalemia: Secondary | ICD-10-CM | POA: Diagnosis not present

## 2020-11-17 DIAGNOSIS — R569 Unspecified convulsions: Secondary | ICD-10-CM | POA: Diagnosis not present

## 2020-11-17 DIAGNOSIS — I700 Atherosclerosis of aorta: Secondary | ICD-10-CM | POA: Diagnosis not present

## 2020-11-17 DIAGNOSIS — Z5902 Unsheltered homelessness: Secondary | ICD-10-CM | POA: Diagnosis not present

## 2020-11-17 DIAGNOSIS — I509 Heart failure, unspecified: Secondary | ICD-10-CM | POA: Diagnosis not present

## 2020-11-17 DIAGNOSIS — K7689 Other specified diseases of liver: Secondary | ICD-10-CM | POA: Diagnosis not present

## 2020-11-17 DIAGNOSIS — I351 Nonrheumatic aortic (valve) insufficiency: Secondary | ICD-10-CM | POA: Diagnosis not present

## 2020-11-17 DIAGNOSIS — A419 Sepsis, unspecified organism: Secondary | ICD-10-CM | POA: Diagnosis not present

## 2020-11-17 DIAGNOSIS — R197 Diarrhea, unspecified: Secondary | ICD-10-CM | POA: Diagnosis not present

## 2020-11-17 DIAGNOSIS — M6281 Muscle weakness (generalized): Secondary | ICD-10-CM | POA: Diagnosis not present

## 2020-11-17 DIAGNOSIS — G40909 Epilepsy, unspecified, not intractable, without status epilepticus: Secondary | ICD-10-CM | POA: Diagnosis not present

## 2020-11-17 DIAGNOSIS — N170 Acute kidney failure with tubular necrosis: Secondary | ICD-10-CM | POA: Diagnosis not present

## 2020-11-17 DIAGNOSIS — Z8673 Personal history of transient ischemic attack (TIA), and cerebral infarction without residual deficits: Secondary | ICD-10-CM | POA: Diagnosis not present

## 2020-11-17 DIAGNOSIS — R7401 Elevation of levels of liver transaminase levels: Secondary | ICD-10-CM | POA: Diagnosis not present

## 2020-11-17 DIAGNOSIS — J9811 Atelectasis: Secondary | ICD-10-CM | POA: Diagnosis not present

## 2020-11-17 DIAGNOSIS — Z4682 Encounter for fitting and adjustment of non-vascular catheter: Secondary | ICD-10-CM | POA: Diagnosis not present

## 2020-11-17 DIAGNOSIS — I517 Cardiomegaly: Secondary | ICD-10-CM | POA: Diagnosis not present

## 2020-11-17 DIAGNOSIS — I5042 Chronic combined systolic (congestive) and diastolic (congestive) heart failure: Secondary | ICD-10-CM | POA: Diagnosis not present

## 2020-11-17 DIAGNOSIS — J939 Pneumothorax, unspecified: Secondary | ICD-10-CM | POA: Diagnosis not present

## 2020-11-17 DIAGNOSIS — S42001A Fracture of unspecified part of right clavicle, initial encounter for closed fracture: Secondary | ICD-10-CM | POA: Diagnosis not present

## 2020-11-17 DIAGNOSIS — S2242XD Multiple fractures of ribs, left side, subsequent encounter for fracture with routine healing: Secondary | ICD-10-CM | POA: Diagnosis not present

## 2020-11-17 DIAGNOSIS — J189 Pneumonia, unspecified organism: Secondary | ICD-10-CM | POA: Diagnosis not present

## 2020-11-17 DIAGNOSIS — R32 Unspecified urinary incontinence: Secondary | ICD-10-CM | POA: Diagnosis not present

## 2020-11-17 DIAGNOSIS — R937 Abnormal findings on diagnostic imaging of other parts of musculoskeletal system: Secondary | ICD-10-CM | POA: Diagnosis not present

## 2020-11-17 DIAGNOSIS — E43 Unspecified severe protein-calorie malnutrition: Secondary | ICD-10-CM | POA: Diagnosis not present

## 2020-11-17 DIAGNOSIS — I110 Hypertensive heart disease with heart failure: Secondary | ICD-10-CM | POA: Diagnosis not present

## 2020-11-17 DIAGNOSIS — I959 Hypotension, unspecified: Secondary | ICD-10-CM | POA: Diagnosis not present

## 2020-11-17 DIAGNOSIS — D649 Anemia, unspecified: Secondary | ICD-10-CM | POA: Diagnosis not present

## 2020-11-17 DIAGNOSIS — S42001S Fracture of unspecified part of right clavicle, sequela: Secondary | ICD-10-CM | POA: Diagnosis not present

## 2020-11-17 DIAGNOSIS — N179 Acute kidney failure, unspecified: Secondary | ICD-10-CM | POA: Diagnosis not present

## 2020-11-17 DIAGNOSIS — S2232XA Fracture of one rib, left side, initial encounter for closed fracture: Secondary | ICD-10-CM | POA: Diagnosis not present

## 2020-11-18 DIAGNOSIS — I5042 Chronic combined systolic (congestive) and diastolic (congestive) heart failure: Secondary | ICD-10-CM | POA: Diagnosis not present

## 2020-11-18 DIAGNOSIS — A419 Sepsis, unspecified organism: Secondary | ICD-10-CM | POA: Diagnosis not present

## 2020-11-18 DIAGNOSIS — S42031A Displaced fracture of lateral end of right clavicle, initial encounter for closed fracture: Secondary | ICD-10-CM | POA: Diagnosis not present

## 2020-11-18 DIAGNOSIS — S42034A Nondisplaced fracture of lateral end of right clavicle, initial encounter for closed fracture: Secondary | ICD-10-CM | POA: Diagnosis not present

## 2020-11-18 DIAGNOSIS — R6521 Severe sepsis with septic shock: Secondary | ICD-10-CM | POA: Diagnosis not present

## 2020-11-18 DIAGNOSIS — I482 Chronic atrial fibrillation, unspecified: Secondary | ICD-10-CM | POA: Diagnosis not present

## 2020-11-19 DIAGNOSIS — R6521 Severe sepsis with septic shock: Secondary | ICD-10-CM | POA: Diagnosis not present

## 2020-11-19 DIAGNOSIS — G9341 Metabolic encephalopathy: Secondary | ICD-10-CM | POA: Diagnosis not present

## 2020-11-19 DIAGNOSIS — Z4682 Encounter for fitting and adjustment of non-vascular catheter: Secondary | ICD-10-CM | POA: Diagnosis not present

## 2020-11-19 DIAGNOSIS — I482 Chronic atrial fibrillation, unspecified: Secondary | ICD-10-CM | POA: Diagnosis not present

## 2020-11-19 DIAGNOSIS — A419 Sepsis, unspecified organism: Secondary | ICD-10-CM | POA: Diagnosis not present

## 2020-11-19 DIAGNOSIS — Z452 Encounter for adjustment and management of vascular access device: Secondary | ICD-10-CM | POA: Diagnosis not present

## 2020-11-20 DIAGNOSIS — I351 Nonrheumatic aortic (valve) insufficiency: Secondary | ICD-10-CM | POA: Diagnosis not present

## 2020-11-20 DIAGNOSIS — I517 Cardiomegaly: Secondary | ICD-10-CM | POA: Diagnosis not present

## 2020-11-20 DIAGNOSIS — Z452 Encounter for adjustment and management of vascular access device: Secondary | ICD-10-CM | POA: Diagnosis not present

## 2020-11-20 DIAGNOSIS — I5042 Chronic combined systolic (congestive) and diastolic (congestive) heart failure: Secondary | ICD-10-CM | POA: Diagnosis not present

## 2020-11-20 DIAGNOSIS — R6521 Severe sepsis with septic shock: Secondary | ICD-10-CM | POA: Diagnosis not present

## 2020-11-20 DIAGNOSIS — I482 Chronic atrial fibrillation, unspecified: Secondary | ICD-10-CM | POA: Diagnosis not present

## 2020-11-20 DIAGNOSIS — A419 Sepsis, unspecified organism: Secondary | ICD-10-CM | POA: Diagnosis not present

## 2020-11-21 DIAGNOSIS — J9811 Atelectasis: Secondary | ICD-10-CM | POA: Diagnosis not present

## 2020-11-21 DIAGNOSIS — N179 Acute kidney failure, unspecified: Secondary | ICD-10-CM | POA: Diagnosis not present

## 2020-11-21 DIAGNOSIS — A419 Sepsis, unspecified organism: Secondary | ICD-10-CM | POA: Diagnosis not present

## 2020-11-21 DIAGNOSIS — I482 Chronic atrial fibrillation, unspecified: Secondary | ICD-10-CM | POA: Diagnosis not present

## 2020-11-21 DIAGNOSIS — R6521 Severe sepsis with septic shock: Secondary | ICD-10-CM | POA: Diagnosis not present

## 2020-11-22 DIAGNOSIS — R6521 Severe sepsis with septic shock: Secondary | ICD-10-CM | POA: Diagnosis not present

## 2020-11-22 DIAGNOSIS — A419 Sepsis, unspecified organism: Secondary | ICD-10-CM | POA: Diagnosis not present

## 2020-11-22 DIAGNOSIS — I482 Chronic atrial fibrillation, unspecified: Secondary | ICD-10-CM | POA: Diagnosis not present

## 2020-11-22 DIAGNOSIS — G9341 Metabolic encephalopathy: Secondary | ICD-10-CM | POA: Diagnosis not present

## 2020-11-23 DIAGNOSIS — E876 Hypokalemia: Secondary | ICD-10-CM | POA: Diagnosis not present

## 2020-11-23 DIAGNOSIS — N179 Acute kidney failure, unspecified: Secondary | ICD-10-CM | POA: Diagnosis not present

## 2020-11-23 DIAGNOSIS — I482 Chronic atrial fibrillation, unspecified: Secondary | ICD-10-CM | POA: Diagnosis not present

## 2020-11-23 DIAGNOSIS — G40909 Epilepsy, unspecified, not intractable, without status epilepticus: Secondary | ICD-10-CM | POA: Diagnosis not present

## 2020-11-24 DIAGNOSIS — I5042 Chronic combined systolic (congestive) and diastolic (congestive) heart failure: Secondary | ICD-10-CM | POA: Diagnosis not present

## 2020-11-24 DIAGNOSIS — E876 Hypokalemia: Secondary | ICD-10-CM | POA: Diagnosis not present

## 2020-11-24 DIAGNOSIS — I482 Chronic atrial fibrillation, unspecified: Secondary | ICD-10-CM | POA: Diagnosis not present

## 2020-11-24 DIAGNOSIS — D6489 Other specified anemias: Secondary | ICD-10-CM | POA: Diagnosis not present

## 2020-11-24 DIAGNOSIS — N179 Acute kidney failure, unspecified: Secondary | ICD-10-CM | POA: Diagnosis not present

## 2020-11-25 DIAGNOSIS — E876 Hypokalemia: Secondary | ICD-10-CM | POA: Diagnosis not present

## 2020-11-25 DIAGNOSIS — G9341 Metabolic encephalopathy: Secondary | ICD-10-CM | POA: Diagnosis not present

## 2020-11-25 DIAGNOSIS — N179 Acute kidney failure, unspecified: Secondary | ICD-10-CM | POA: Diagnosis not present

## 2020-11-25 DIAGNOSIS — I5042 Chronic combined systolic (congestive) and diastolic (congestive) heart failure: Secondary | ICD-10-CM | POA: Diagnosis not present

## 2020-11-26 DIAGNOSIS — G9341 Metabolic encephalopathy: Secondary | ICD-10-CM | POA: Diagnosis not present

## 2020-11-26 DIAGNOSIS — E876 Hypokalemia: Secondary | ICD-10-CM | POA: Diagnosis not present

## 2020-11-26 DIAGNOSIS — I5042 Chronic combined systolic (congestive) and diastolic (congestive) heart failure: Secondary | ICD-10-CM | POA: Diagnosis not present

## 2020-11-26 DIAGNOSIS — N179 Acute kidney failure, unspecified: Secondary | ICD-10-CM | POA: Diagnosis not present

## 2020-11-27 DIAGNOSIS — I5042 Chronic combined systolic (congestive) and diastolic (congestive) heart failure: Secondary | ICD-10-CM | POA: Diagnosis not present

## 2020-11-27 DIAGNOSIS — R197 Diarrhea, unspecified: Secondary | ICD-10-CM | POA: Diagnosis not present

## 2020-11-27 DIAGNOSIS — G9341 Metabolic encephalopathy: Secondary | ICD-10-CM | POA: Diagnosis not present

## 2020-11-27 DIAGNOSIS — E876 Hypokalemia: Secondary | ICD-10-CM | POA: Diagnosis not present

## 2020-12-04 DIAGNOSIS — S42031A Displaced fracture of lateral end of right clavicle, initial encounter for closed fracture: Secondary | ICD-10-CM | POA: Diagnosis not present

## 2020-12-06 DIAGNOSIS — A419 Sepsis, unspecified organism: Secondary | ICD-10-CM | POA: Diagnosis not present

## 2020-12-06 DIAGNOSIS — I10 Essential (primary) hypertension: Secondary | ICD-10-CM | POA: Diagnosis not present

## 2020-12-06 DIAGNOSIS — G9341 Metabolic encephalopathy: Secondary | ICD-10-CM | POA: Diagnosis not present

## 2020-12-06 DIAGNOSIS — Z23 Encounter for immunization: Secondary | ICD-10-CM | POA: Diagnosis not present

## 2020-12-06 DIAGNOSIS — R6521 Severe sepsis with septic shock: Secondary | ICD-10-CM | POA: Diagnosis not present

## 2020-12-06 DIAGNOSIS — I639 Cerebral infarction, unspecified: Secondary | ICD-10-CM | POA: Diagnosis not present

## 2020-12-06 DIAGNOSIS — R279 Unspecified lack of coordination: Secondary | ICD-10-CM | POA: Diagnosis not present

## 2020-12-06 DIAGNOSIS — M6281 Muscle weakness (generalized): Secondary | ICD-10-CM | POA: Diagnosis not present

## 2020-12-06 DIAGNOSIS — G40509 Epileptic seizures related to external causes, not intractable, without status epilepticus: Secondary | ICD-10-CM | POA: Diagnosis not present

## 2020-12-06 DIAGNOSIS — I482 Chronic atrial fibrillation, unspecified: Secondary | ICD-10-CM | POA: Diagnosis not present

## 2020-12-06 DIAGNOSIS — R5381 Other malaise: Secondary | ICD-10-CM | POA: Diagnosis not present

## 2020-12-06 DIAGNOSIS — G40909 Epilepsy, unspecified, not intractable, without status epilepticus: Secondary | ICD-10-CM | POA: Diagnosis not present

## 2020-12-06 DIAGNOSIS — R296 Repeated falls: Secondary | ICD-10-CM | POA: Diagnosis not present

## 2020-12-06 DIAGNOSIS — S42001S Fracture of unspecified part of right clavicle, sequela: Secondary | ICD-10-CM | POA: Diagnosis not present

## 2020-12-06 DIAGNOSIS — I5042 Chronic combined systolic (congestive) and diastolic (congestive) heart failure: Secondary | ICD-10-CM | POA: Diagnosis not present

## 2020-12-11 DIAGNOSIS — I5042 Chronic combined systolic (congestive) and diastolic (congestive) heart failure: Secondary | ICD-10-CM | POA: Diagnosis not present

## 2020-12-11 DIAGNOSIS — I482 Chronic atrial fibrillation, unspecified: Secondary | ICD-10-CM | POA: Diagnosis not present

## 2020-12-11 DIAGNOSIS — G40909 Epilepsy, unspecified, not intractable, without status epilepticus: Secondary | ICD-10-CM | POA: Diagnosis not present

## 2020-12-11 DIAGNOSIS — R5381 Other malaise: Secondary | ICD-10-CM | POA: Diagnosis not present

## 2020-12-11 DIAGNOSIS — I639 Cerebral infarction, unspecified: Secondary | ICD-10-CM | POA: Diagnosis not present

## 2021-01-05 DIAGNOSIS — Z23 Encounter for immunization: Secondary | ICD-10-CM | POA: Diagnosis not present

## 2021-01-23 DIAGNOSIS — G40909 Epilepsy, unspecified, not intractable, without status epilepticus: Secondary | ICD-10-CM | POA: Diagnosis not present

## 2021-01-23 DIAGNOSIS — R5381 Other malaise: Secondary | ICD-10-CM | POA: Diagnosis not present

## 2021-01-23 DIAGNOSIS — I639 Cerebral infarction, unspecified: Secondary | ICD-10-CM | POA: Diagnosis not present

## 2021-01-23 DIAGNOSIS — I482 Chronic atrial fibrillation, unspecified: Secondary | ICD-10-CM | POA: Diagnosis not present

## 2021-01-23 DIAGNOSIS — I5042 Chronic combined systolic (congestive) and diastolic (congestive) heart failure: Secondary | ICD-10-CM | POA: Diagnosis not present

## 2021-01-26 DIAGNOSIS — I482 Chronic atrial fibrillation, unspecified: Secondary | ICD-10-CM | POA: Diagnosis not present

## 2021-01-26 DIAGNOSIS — R262 Difficulty in walking, not elsewhere classified: Secondary | ICD-10-CM | POA: Diagnosis not present

## 2021-01-26 DIAGNOSIS — I10 Essential (primary) hypertension: Secondary | ICD-10-CM | POA: Diagnosis not present

## 2021-01-26 DIAGNOSIS — G40509 Epileptic seizures related to external causes, not intractable, without status epilepticus: Secondary | ICD-10-CM | POA: Diagnosis not present

## 2021-01-26 DIAGNOSIS — R600 Localized edema: Secondary | ICD-10-CM | POA: Diagnosis not present

## 2021-01-26 DIAGNOSIS — A419 Sepsis, unspecified organism: Secondary | ICD-10-CM | POA: Diagnosis not present

## 2021-01-26 DIAGNOSIS — M6281 Muscle weakness (generalized): Secondary | ICD-10-CM | POA: Diagnosis not present

## 2021-01-26 DIAGNOSIS — Z723 Lack of physical exercise: Secondary | ICD-10-CM | POA: Diagnosis not present

## 2021-01-26 DIAGNOSIS — I5042 Chronic combined systolic (congestive) and diastolic (congestive) heart failure: Secondary | ICD-10-CM | POA: Diagnosis not present

## 2021-01-27 ENCOUNTER — Other Ambulatory Visit: Payer: Self-pay

## 2021-01-27 NOTE — Patient Outreach (Signed)
[facility]) Care Management  01/27/2021  Steffon Gladu 1941/02/23 [id number]     Transition of Care Referral  Referral Date: 01/27/2021 Referral Source: Discharge Report Date of Discharge: 01/25/2021 Facility: [facility]     Referral received. Upon chart review and verification in EMR system patient remains inpatient at [facility].    Plan: RN CM will close referral.   [name], RN,BSN,CCM [facility] Care Management Telephonic Care Management Coordinator Direct Phone: [phone] Toll Free: [phone] Fax: [phone]

## 2021-02-06 DIAGNOSIS — G40909 Epilepsy, unspecified, not intractable, without status epilepticus: Secondary | ICD-10-CM | POA: Diagnosis not present

## 2021-02-06 DIAGNOSIS — I5042 Chronic combined systolic (congestive) and diastolic (congestive) heart failure: Secondary | ICD-10-CM | POA: Diagnosis not present

## 2021-03-07 DIAGNOSIS — I5042 Chronic combined systolic (congestive) and diastolic (congestive) heart failure: Secondary | ICD-10-CM | POA: Diagnosis not present

## 2021-03-07 DIAGNOSIS — L97419 Non-pressure chronic ulcer of right heel and midfoot with unspecified severity: Secondary | ICD-10-CM | POA: Diagnosis not present

## 2021-03-07 DIAGNOSIS — L97412 Non-pressure chronic ulcer of right heel and midfoot with fat layer exposed: Secondary | ICD-10-CM | POA: Diagnosis not present

## 2021-03-07 DIAGNOSIS — G8918 Other acute postprocedural pain: Secondary | ICD-10-CM | POA: Diagnosis not present

## 2021-03-07 DIAGNOSIS — I4891 Unspecified atrial fibrillation: Secondary | ICD-10-CM | POA: Diagnosis not present

## 2021-03-07 DIAGNOSIS — N401 Enlarged prostate with lower urinary tract symptoms: Secondary | ICD-10-CM | POA: Diagnosis not present

## 2021-03-07 DIAGNOSIS — M799 Soft tissue disorder, unspecified: Secondary | ICD-10-CM | POA: Diagnosis not present

## 2021-03-07 DIAGNOSIS — N3001 Acute cystitis with hematuria: Secondary | ICD-10-CM | POA: Diagnosis not present

## 2021-03-07 DIAGNOSIS — M869 Osteomyelitis, unspecified: Secondary | ICD-10-CM | POA: Diagnosis not present

## 2021-03-07 DIAGNOSIS — L97519 Non-pressure chronic ulcer of other part of right foot with unspecified severity: Secondary | ICD-10-CM | POA: Diagnosis not present

## 2021-03-07 DIAGNOSIS — M7989 Other specified soft tissue disorders: Secondary | ICD-10-CM | POA: Diagnosis not present

## 2021-03-07 DIAGNOSIS — N390 Urinary tract infection, site not specified: Secondary | ICD-10-CM | POA: Diagnosis not present

## 2021-03-07 DIAGNOSIS — G40909 Epilepsy, unspecified, not intractable, without status epilepticus: Secondary | ICD-10-CM | POA: Diagnosis not present

## 2021-03-07 DIAGNOSIS — S91331A Puncture wound without foreign body, right foot, initial encounter: Secondary | ICD-10-CM | POA: Diagnosis not present

## 2021-03-07 DIAGNOSIS — M86171 Other acute osteomyelitis, right ankle and foot: Secondary | ICD-10-CM | POA: Diagnosis not present

## 2021-03-07 DIAGNOSIS — L89513 Pressure ulcer of right ankle, stage 3: Secondary | ICD-10-CM | POA: Diagnosis not present

## 2021-03-07 DIAGNOSIS — N400 Enlarged prostate without lower urinary tract symptoms: Secondary | ICD-10-CM | POA: Diagnosis not present

## 2021-03-07 DIAGNOSIS — L97413 Non-pressure chronic ulcer of right heel and midfoot with necrosis of muscle: Secondary | ICD-10-CM | POA: Diagnosis not present

## 2021-03-07 DIAGNOSIS — I110 Hypertensive heart disease with heart failure: Secondary | ICD-10-CM | POA: Diagnosis not present

## 2021-03-07 DIAGNOSIS — R350 Frequency of micturition: Secondary | ICD-10-CM | POA: Diagnosis not present

## 2021-03-07 DIAGNOSIS — I509 Heart failure, unspecified: Secondary | ICD-10-CM | POA: Diagnosis not present

## 2021-03-07 DIAGNOSIS — I5030 Unspecified diastolic (congestive) heart failure: Secondary | ICD-10-CM | POA: Diagnosis not present

## 2021-03-07 DIAGNOSIS — R4182 Altered mental status, unspecified: Secondary | ICD-10-CM | POA: Diagnosis not present

## 2021-03-07 DIAGNOSIS — L89613 Pressure ulcer of right heel, stage 3: Secondary | ICD-10-CM | POA: Diagnosis not present

## 2021-03-07 DIAGNOSIS — L03115 Cellulitis of right lower limb: Secondary | ICD-10-CM | POA: Diagnosis not present

## 2021-03-07 DIAGNOSIS — I482 Chronic atrial fibrillation, unspecified: Secondary | ICD-10-CM | POA: Diagnosis not present

## 2021-03-07 DIAGNOSIS — I5020 Unspecified systolic (congestive) heart failure: Secondary | ICD-10-CM | POA: Diagnosis not present

## 2021-03-07 DIAGNOSIS — R601 Generalized edema: Secondary | ICD-10-CM | POA: Diagnosis not present

## 2021-03-07 DIAGNOSIS — D649 Anemia, unspecified: Secondary | ICD-10-CM | POA: Diagnosis not present

## 2021-03-07 DIAGNOSIS — L89610 Pressure ulcer of right heel, unstageable: Secondary | ICD-10-CM | POA: Diagnosis not present

## 2021-03-07 DIAGNOSIS — U071 COVID-19: Secondary | ICD-10-CM | POA: Diagnosis not present

## 2021-03-07 DIAGNOSIS — Z452 Encounter for adjustment and management of vascular access device: Secondary | ICD-10-CM | POA: Diagnosis not present

## 2021-03-07 DIAGNOSIS — D638 Anemia in other chronic diseases classified elsewhere: Secondary | ICD-10-CM | POA: Diagnosis not present

## 2021-03-07 DIAGNOSIS — L89619 Pressure ulcer of right heel, unspecified stage: Secondary | ICD-10-CM | POA: Diagnosis not present

## 2021-03-07 DIAGNOSIS — M868X7 Other osteomyelitis, ankle and foot: Secondary | ICD-10-CM | POA: Diagnosis not present

## 2021-03-07 DIAGNOSIS — L0390 Cellulitis, unspecified: Secondary | ICD-10-CM | POA: Diagnosis not present

## 2021-03-07 DIAGNOSIS — L89893 Pressure ulcer of other site, stage 3: Secondary | ICD-10-CM | POA: Diagnosis not present

## 2021-03-08 DIAGNOSIS — I5042 Chronic combined systolic (congestive) and diastolic (congestive) heart failure: Secondary | ICD-10-CM | POA: Diagnosis not present

## 2021-03-08 DIAGNOSIS — U071 COVID-19: Secondary | ICD-10-CM | POA: Diagnosis not present

## 2021-03-08 DIAGNOSIS — L89610 Pressure ulcer of right heel, unstageable: Secondary | ICD-10-CM | POA: Diagnosis not present

## 2021-03-08 DIAGNOSIS — R350 Frequency of micturition: Secondary | ICD-10-CM | POA: Diagnosis not present

## 2021-03-21 DIAGNOSIS — R601 Generalized edema: Secondary | ICD-10-CM | POA: Diagnosis not present

## 2021-03-21 DIAGNOSIS — M868X7 Other osteomyelitis, ankle and foot: Secondary | ICD-10-CM | POA: Diagnosis not present

## 2021-03-21 DIAGNOSIS — D638 Anemia in other chronic diseases classified elsewhere: Secondary | ICD-10-CM | POA: Diagnosis not present

## 2021-03-21 DIAGNOSIS — N401 Enlarged prostate with lower urinary tract symptoms: Secondary | ICD-10-CM | POA: Diagnosis not present

## 2021-03-21 DIAGNOSIS — G40909 Epilepsy, unspecified, not intractable, without status epilepticus: Secondary | ICD-10-CM | POA: Diagnosis not present

## 2021-03-21 DIAGNOSIS — U071 COVID-19: Secondary | ICD-10-CM | POA: Diagnosis not present

## 2021-03-21 DIAGNOSIS — L89613 Pressure ulcer of right heel, stage 3: Secondary | ICD-10-CM | POA: Diagnosis not present

## 2021-03-21 DIAGNOSIS — L03115 Cellulitis of right lower limb: Secondary | ICD-10-CM | POA: Diagnosis not present

## 2021-03-21 DIAGNOSIS — N400 Enlarged prostate without lower urinary tract symptoms: Secondary | ICD-10-CM | POA: Diagnosis not present

## 2021-03-21 DIAGNOSIS — D62 Acute posthemorrhagic anemia: Secondary | ICD-10-CM | POA: Diagnosis not present

## 2021-03-21 DIAGNOSIS — I4891 Unspecified atrial fibrillation: Secondary | ICD-10-CM | POA: Diagnosis not present

## 2021-03-21 DIAGNOSIS — I110 Hypertensive heart disease with heart failure: Secondary | ICD-10-CM | POA: Diagnosis not present

## 2021-03-21 DIAGNOSIS — L97412 Non-pressure chronic ulcer of right heel and midfoot with fat layer exposed: Secondary | ICD-10-CM | POA: Diagnosis not present

## 2021-03-21 DIAGNOSIS — I482 Chronic atrial fibrillation, unspecified: Secondary | ICD-10-CM | POA: Diagnosis not present

## 2021-03-21 DIAGNOSIS — M869 Osteomyelitis, unspecified: Secondary | ICD-10-CM | POA: Diagnosis not present

## 2021-03-21 DIAGNOSIS — I5042 Chronic combined systolic (congestive) and diastolic (congestive) heart failure: Secondary | ICD-10-CM | POA: Diagnosis not present

## 2021-03-21 DIAGNOSIS — N3001 Acute cystitis with hematuria: Secondary | ICD-10-CM | POA: Diagnosis not present

## 2021-03-23 DIAGNOSIS — L89613 Pressure ulcer of right heel, stage 3: Secondary | ICD-10-CM | POA: Diagnosis not present

## 2021-03-23 DIAGNOSIS — U071 COVID-19: Secondary | ICD-10-CM | POA: Diagnosis not present

## 2021-03-23 DIAGNOSIS — D62 Acute posthemorrhagic anemia: Secondary | ICD-10-CM | POA: Diagnosis not present

## 2021-03-23 DIAGNOSIS — N400 Enlarged prostate without lower urinary tract symptoms: Secondary | ICD-10-CM | POA: Diagnosis not present

## 2021-03-23 DIAGNOSIS — L03115 Cellulitis of right lower limb: Secondary | ICD-10-CM | POA: Diagnosis not present

## 2021-03-23 DIAGNOSIS — I4891 Unspecified atrial fibrillation: Secondary | ICD-10-CM | POA: Diagnosis not present

## 2021-03-23 DIAGNOSIS — I5042 Chronic combined systolic (congestive) and diastolic (congestive) heart failure: Secondary | ICD-10-CM | POA: Diagnosis not present

## 2021-03-23 DIAGNOSIS — G40909 Epilepsy, unspecified, not intractable, without status epilepticus: Secondary | ICD-10-CM | POA: Diagnosis not present

## 2021-03-23 DIAGNOSIS — M869 Osteomyelitis, unspecified: Secondary | ICD-10-CM | POA: Diagnosis not present

## 2021-03-24 DIAGNOSIS — N400 Enlarged prostate without lower urinary tract symptoms: Secondary | ICD-10-CM | POA: Diagnosis not present

## 2021-03-24 DIAGNOSIS — U071 COVID-19: Secondary | ICD-10-CM | POA: Diagnosis not present

## 2021-03-24 DIAGNOSIS — I4891 Unspecified atrial fibrillation: Secondary | ICD-10-CM | POA: Diagnosis not present

## 2021-03-24 DIAGNOSIS — L03115 Cellulitis of right lower limb: Secondary | ICD-10-CM | POA: Diagnosis not present

## 2021-03-24 DIAGNOSIS — I5042 Chronic combined systolic (congestive) and diastolic (congestive) heart failure: Secondary | ICD-10-CM | POA: Diagnosis not present

## 2021-03-24 DIAGNOSIS — G40909 Epilepsy, unspecified, not intractable, without status epilepticus: Secondary | ICD-10-CM | POA: Diagnosis not present

## 2021-03-24 DIAGNOSIS — M869 Osteomyelitis, unspecified: Secondary | ICD-10-CM | POA: Diagnosis not present

## 2021-03-24 DIAGNOSIS — L89613 Pressure ulcer of right heel, stage 3: Secondary | ICD-10-CM | POA: Diagnosis not present

## 2021-03-24 DIAGNOSIS — D62 Acute posthemorrhagic anemia: Secondary | ICD-10-CM | POA: Diagnosis not present

## 2021-04-01 ENCOUNTER — Other Ambulatory Visit: Payer: Self-pay

## 2021-04-01 NOTE — Patient Outreach (Signed)
[facility]) Care Management  04/01/2021  Brandon Mata 27-Jun-1940 [id number]  [employer] member assigned to Enzo Montgomery, RN from insurance due to SNF discharged for cellulitis of right lower limb.  Ina Homes [facility] Management Assistant [phone]

## 2021-04-01 NOTE — Patient Outreach (Addendum)
[facility]) Care Management  04/01/2021  Brandon Mata 02/20/1941 [id number]     Transition of Care Referral  Referral Date: 04/01/2021 Referral Source: Discharge Report Date of Discharge: 03/29/2021 Facility: [facility]     Referral received. Upon chart review noted that patient remains inpatient -changed to "SWING" bed status. Verified in Patient PING pt remains inpatient.     Plan: RN CM will close referral.    [name], RN,BSN,CCM [facility] Care Management Telephonic Care Management Coordinator Direct Phone: [phone] Toll Free: [phone] Fax: [phone]

## 2021-05-13 IMAGING — CT CT CERVICAL SPINE W/O CM
4 of 7 series · 16 of 33 positions shown, 17 images · non-contrast
Comparison: None.

CLINICAL DATA: Pain following fall

EXAM:
CT HEAD WITHOUT CONTRAST
CT CERVICAL SPINE WITHOUT CONTRAST
TECHNIQUE: Multidetector CT imaging of the head and cervical spine was
performed following the standard protocol without intravenous
contrast. Multiplanar CT image reconstructions of the cervical spine
were also generated.

[Series 5: head bone · axial · 0.41mm/px · z∈[-128,-22]mm · 4 of 89 slices shown]
[im 18/89  bone]
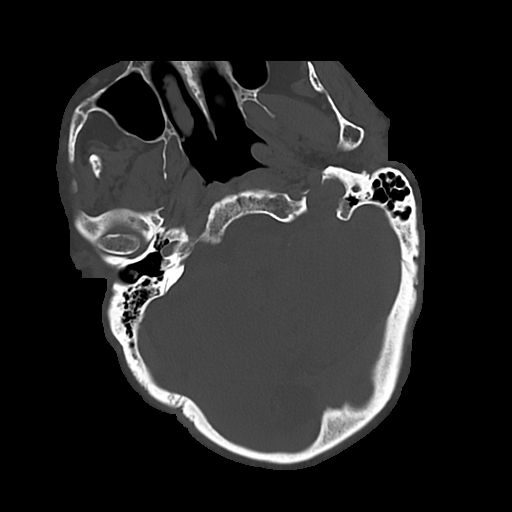
[im 36/89  bone]
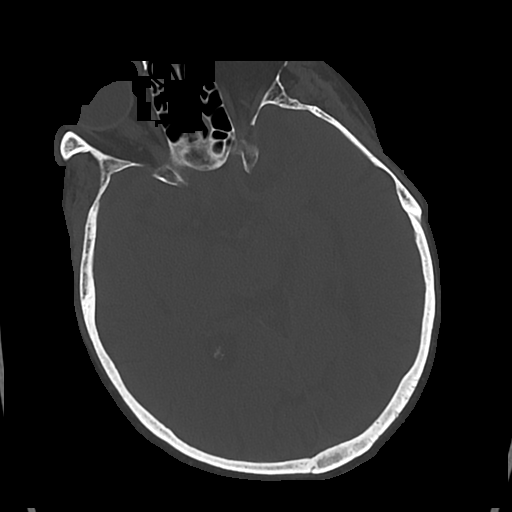
[im 53/89  bone]
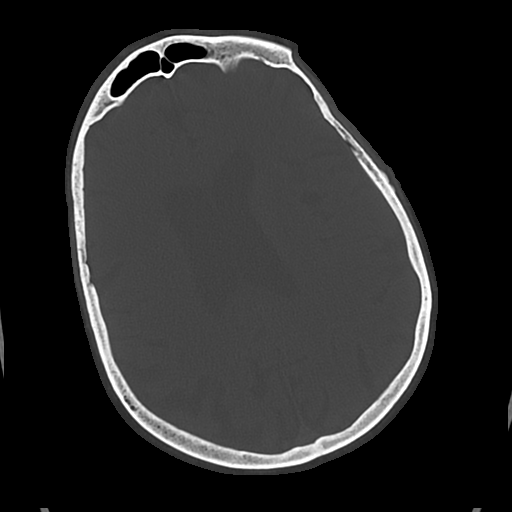
[im 71/89  bone]
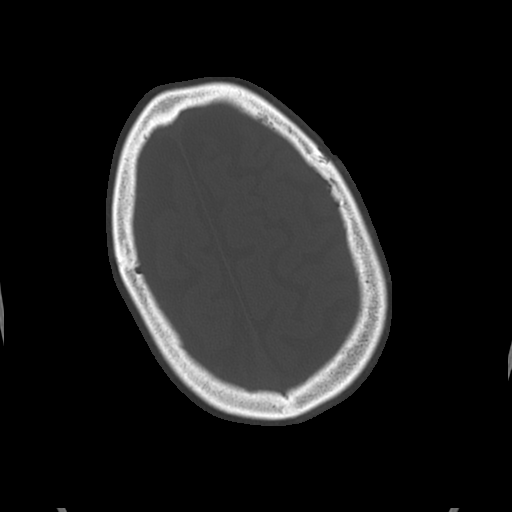

[Series 9: c spine soft · axial · 0.44mm/px · z∈[-256,-154]mm · 4 of 87 slices shown, 5 images]
[im 18/87  soft-tissue]
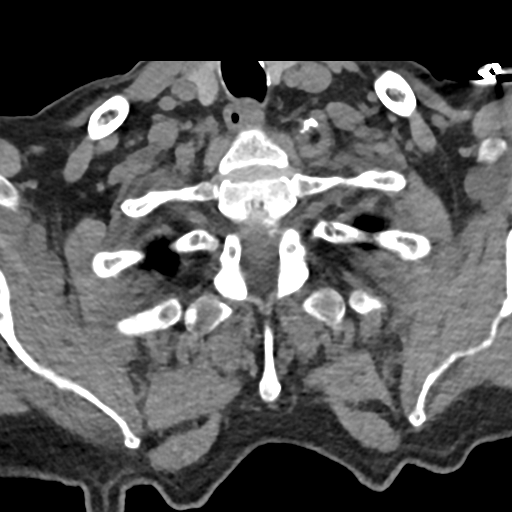
[im 18/87  bone]
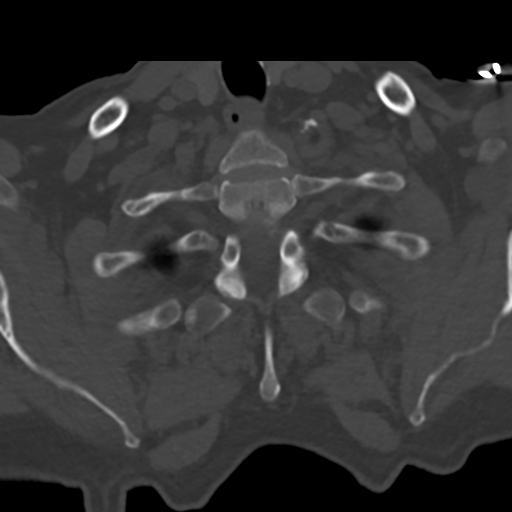
[im 35/87  bone]
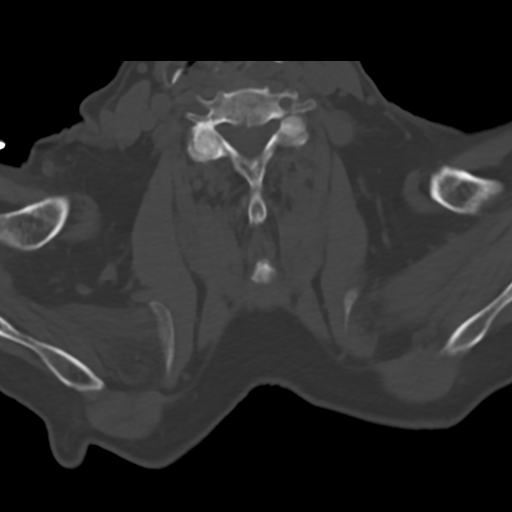
[im 52/87  bone]
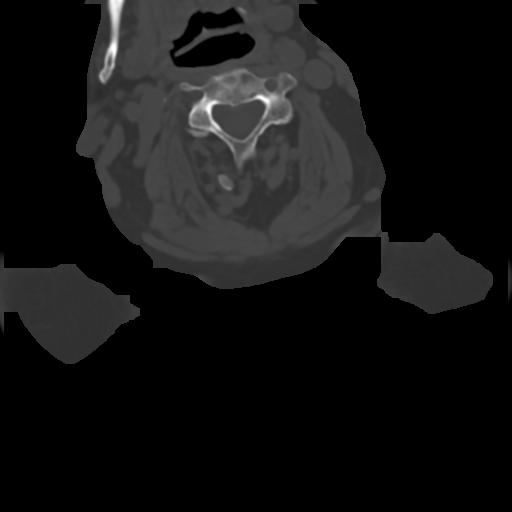
[im 69/87  bone]
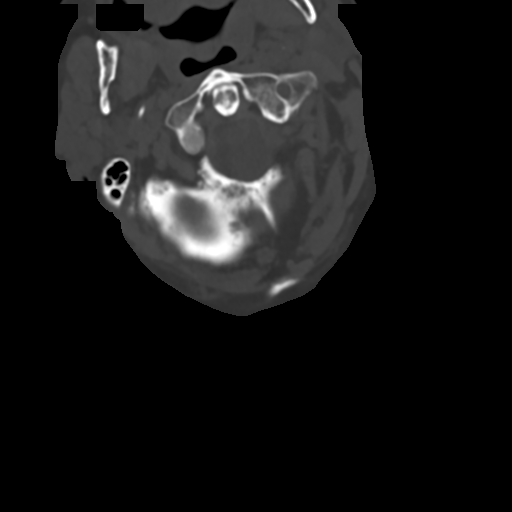

[Series 12: sag bone · sagittal · 0.39mm/px · 5 of 68 slices shown]
[im 12/68  bone]
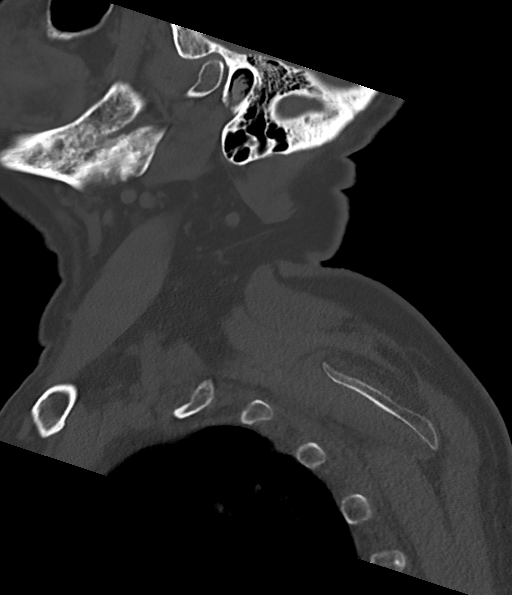
[im 23/68  bone]
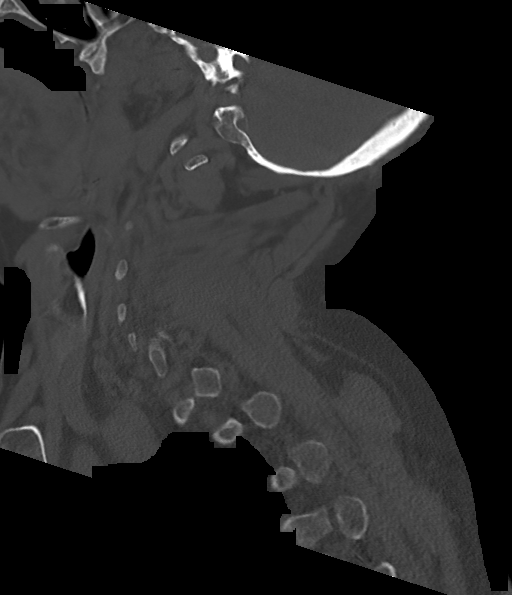
[im 34/68  bone]
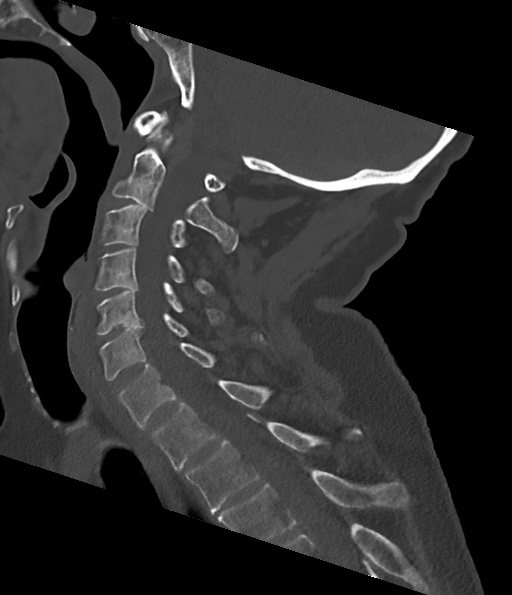
[im 45/68  bone]
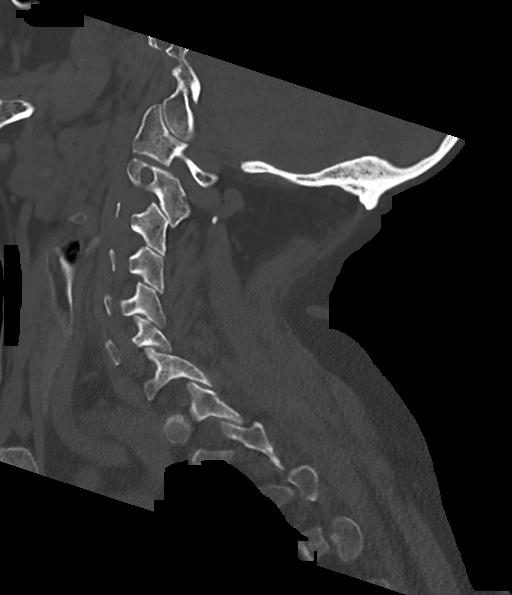
[im 56/68  bone]
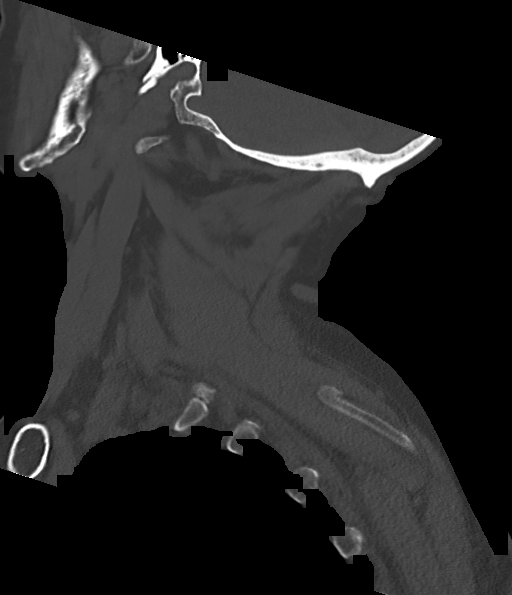

[Series 13: cor bone · coronal · 0.36mm/px · 3 of 85 slices shown]
[im 22/85  bone]
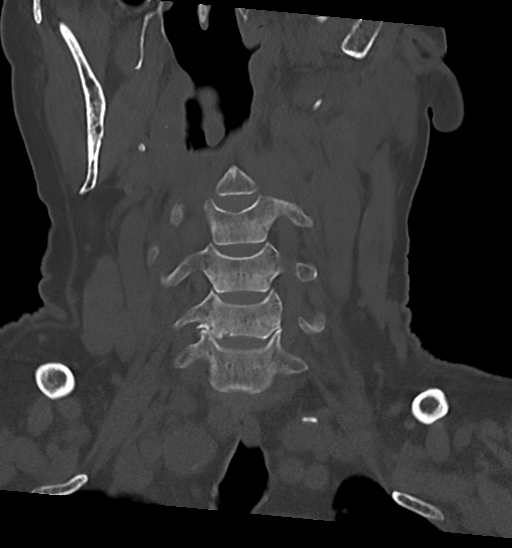
[im 43/85  bone]
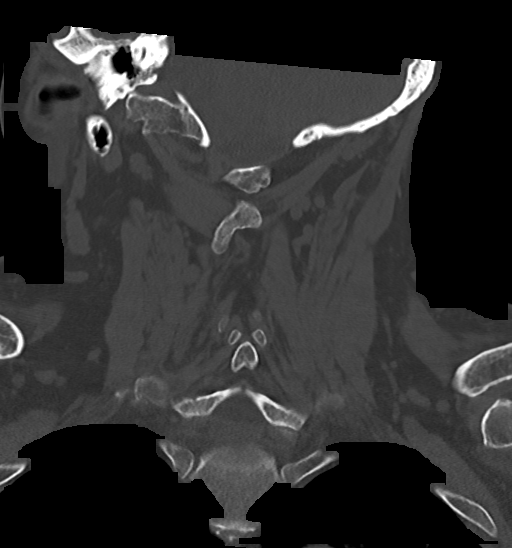
[im 64/85  bone]
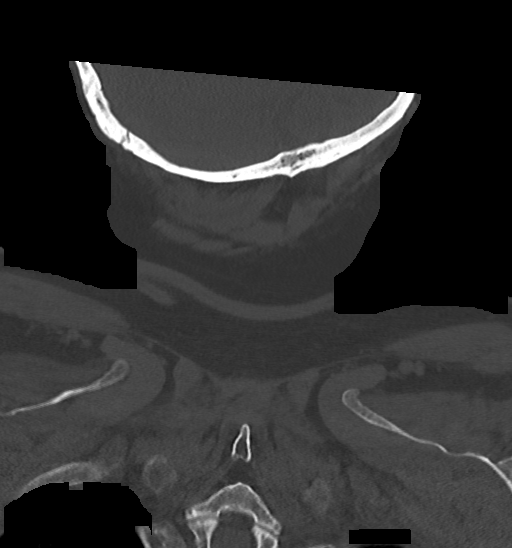

[16 of 33 positions shown; findings below may reference images not displayed]

FINDINGS: CT HEAD FINDINGS

Brain: There is age related volume loss. Prominence of the cisterna
magna is an apparent anatomic variant. There is no evident
intracranial mass hemorrhage, extra-axial fluid collection, or
midline shift. There is patchy small vessel disease in the centra
semiovale bilaterally. No acute infarct evident.

Vascular: No hyperdense vessel. No appreciable vascular
calcification evident.

Skull: The bony calvarium appears intact. There is a small right
frontal scalp hematoma.

Sinuses/Orbits: There is mild mucosal thickening in several ethmoid
air cells. There is a retention cyst in the inferior right maxillary
antrum. Other paranasal sinuses are clear. Orbits appear symmetric
bilaterally.

Other: Mastoid air cells are clear. There is debris in each external
auditory canal.

CT CERVICAL SPINE FINDINGS

Alignment: There is no evident spondylolisthesis.

Skull base and vertebrae: Skull base and craniocervical junction
regions appear normal. No fracture evident. There is lucency in the
posterior right first rib with cortical thinning in this area. No
similar changes elsewhere.

Soft tissues and spinal canal: Prevertebral soft tissues and
predental space regions are normal. No cord or canal hematoma
evident. No paraspinous lesions.

Disc levels: There is slight disc space narrowing posteriorly C5-6.
Other disc spaces appear unremarkable. There is central disc bulging
at C2-3 which abuts but does not efface the cord. There is
multilevel facet hypertrophy. There is no frank disc extrusion or
stenosis. Note that there is exit foraminal narrowing due to bony
hypertrophy at C5-6 bilaterally due to bony hypertrophy.

Upper chest: Visualized upper lung regions are clear.

Other: There is slight calcification in the right carotid artery.
IMPRESSION: CT head:

1. Age related volume loss with patchy periventricular small vessel
disease. No acute infarct. No mass or hemorrhage.

2.  Small right frontal scalp hematoma.  No evident fracture.

3.  Foci of paranasal sinus disease.

4.  Probable cerumen in each external auditory canal.

CT cervical spine:

1.  No fracture or spondylolisthesis.

2. Osteoarthritic change, most severe at C5-6. No frank disc
extrusion or stenosis.

3. Lucency involving a portion of the posterior right first rib with
cortical thinning. This appearance potentially may be indicative
lytic phase of Paget's disease. Neoplastic change in this bone is a
differential consideration. Given this finding, it may be prudent to
correlate nonemergently with nuclear medicine bone scan to assess
for potential other areas of similar abnormality.

## 2022-04-08 ENCOUNTER — Inpatient Hospital Stay: Admit: 2022-04-08 | Payer: Medicare HMO | Admitting: Emergency Medicine

## 2022-04-08 ENCOUNTER — Encounter (HOSPITAL_COMMUNITY): Payer: Self-pay
